# Patient Record
Sex: Female | Born: 1945
Health system: Southern US, Community
[De-identification: ages and names within clinical notes are randomized; demographics above are authoritative.]

## PROBLEM LIST (undated history)

## (undated) DIAGNOSIS — M1990 Unspecified osteoarthritis, unspecified site: Secondary | ICD-10-CM

## (undated) DIAGNOSIS — D649 Anemia, unspecified: Secondary | ICD-10-CM

## (undated) DIAGNOSIS — I10 Essential (primary) hypertension: Secondary | ICD-10-CM

## (undated) DIAGNOSIS — R011 Cardiac murmur, unspecified: Secondary | ICD-10-CM

## (undated) DIAGNOSIS — Z8489 Family history of other specified conditions: Secondary | ICD-10-CM

## (undated) HISTORY — PX: DILATION AND CURETTAGE, DIAGNOSTIC / THERAPEUTIC: SUR384

## (undated) HISTORY — PX: BUNIONECTOMY: SHX129

## (undated) HISTORY — PX: COLONOSCOPY: SHX174

## (undated) HISTORY — PX: TUBAL LIGATION: SHX77

## (undated) HISTORY — PX: EYE SURGERY: SHX253

---

## 1997-09-16 ENCOUNTER — Other Ambulatory Visit: Admission: RE | Admit: 1997-09-16 | Discharge: 1997-09-16 | Payer: Self-pay | Admitting: *Deleted

## 1997-11-13 ENCOUNTER — Other Ambulatory Visit: Admission: RE | Admit: 1997-11-13 | Discharge: 1997-11-13 | Payer: Self-pay | Admitting: *Deleted

## 1998-05-12 ENCOUNTER — Ambulatory Visit (HOSPITAL_COMMUNITY): Admission: RE | Admit: 1998-05-12 | Discharge: 1998-05-12 | Payer: Self-pay

## 1999-07-29 ENCOUNTER — Encounter: Payer: Self-pay | Admitting: *Deleted

## 1999-07-29 ENCOUNTER — Encounter: Admission: RE | Admit: 1999-07-29 | Discharge: 1999-07-29 | Payer: Self-pay | Admitting: *Deleted

## 1999-08-23 ENCOUNTER — Other Ambulatory Visit: Admission: RE | Admit: 1999-08-23 | Discharge: 1999-08-23 | Payer: Self-pay | Admitting: *Deleted

## 1999-08-24 ENCOUNTER — Encounter: Payer: Self-pay | Admitting: *Deleted

## 1999-08-24 ENCOUNTER — Encounter: Admission: RE | Admit: 1999-08-24 | Discharge: 1999-08-24 | Payer: Self-pay | Admitting: *Deleted

## 1999-12-05 ENCOUNTER — Other Ambulatory Visit: Admission: RE | Admit: 1999-12-05 | Discharge: 1999-12-05 | Payer: Self-pay | Admitting: *Deleted

## 2000-02-02 ENCOUNTER — Encounter: Admission: RE | Admit: 2000-02-02 | Discharge: 2000-02-02 | Payer: Self-pay | Admitting: *Deleted

## 2000-02-02 ENCOUNTER — Encounter: Payer: Self-pay | Admitting: *Deleted

## 2000-08-23 ENCOUNTER — Other Ambulatory Visit: Admission: RE | Admit: 2000-08-23 | Discharge: 2000-08-23 | Payer: Self-pay | Admitting: *Deleted

## 2002-07-16 ENCOUNTER — Other Ambulatory Visit: Admission: RE | Admit: 2002-07-16 | Discharge: 2002-07-16 | Payer: Self-pay | Admitting: *Deleted

## 2013-09-21 ENCOUNTER — Encounter: Payer: Self-pay | Admitting: *Deleted

## 2013-09-21 DIAGNOSIS — M059 Rheumatoid arthritis with rheumatoid factor, unspecified: Secondary | ICD-10-CM

## 2015-04-14 ENCOUNTER — Other Ambulatory Visit (HOSPITAL_COMMUNITY): Payer: Self-pay | Admitting: Respiratory Therapy

## 2015-04-14 DIAGNOSIS — J40 Bronchitis, not specified as acute or chronic: Secondary | ICD-10-CM

## 2015-05-28 ENCOUNTER — Encounter (HOSPITAL_COMMUNITY): Payer: Self-pay

## 2015-07-23 ENCOUNTER — Ambulatory Visit (HOSPITAL_COMMUNITY)
Admission: RE | Admit: 2015-07-23 | Discharge: 2015-07-23 | Disposition: A | Discharge status: Home / Self Care | Payer: Medicare Other | Source: Ambulatory Visit | Attending: Internal Medicine | Admitting: Internal Medicine

## 2015-07-23 DIAGNOSIS — J40 Bronchitis, not specified as acute or chronic: Secondary | ICD-10-CM | POA: Diagnosis not present

## 2015-07-23 LAB — PULMONARY FUNCTION TEST
DL/VA % pred: 79 %
DL/VA: 3.73 ml/min/mmHg/L
DLCO unc % pred: 76 %
DLCO unc: 17.48 ml/min/mmHg
FEF 25-75 Post: 1.6 L/sec
FEF 25-75 Pre: 1.53 L/sec
FEF2575-%Change-Post: 4 %
FEF2575-%Pred-Post: 85 %
FEF2575-%Pred-Pre: 81 %
FEV1-%Change-Post: 1 %
FEV1-%Pred-Post: 105 %
FEV1-%Pred-Pre: 104 %
FEV1-Post: 2.31 L
FEV1-Pre: 2.29 L
FEV1FVC-%Change-Post: 5 %
FEV1FVC-%Pred-Pre: 94 %
FEV6-%Change-Post: -3 %
FEV6-%Pred-Post: 107 %
FEV6-%Pred-Pre: 111 %
FEV6-Post: 2.98 L
FEV6-Pre: 3.08 L
FEV6FVC-%Change-Post: 2 %
FEV6FVC-%Pred-Post: 104 %
FEV6FVC-%Pred-Pre: 101 %
FVC-%Change-Post: -4 %
FVC-%Pred-Post: 104 %
FVC-%Pred-Pre: 109 %
FVC-Post: 3.03 L
FVC-Pre: 3.17 L
Post FEV1/FVC ratio: 76 %
Post FEV6/FVC ratio: 100 %
Pre FEV1/FVC ratio: 72 %
Pre FEV6/FVC Ratio: 97 %
RV % pred: 107 %
RV: 2.29 L
TLC % pred: 109 %
TLC: 5.36 L

## 2015-07-23 MED ORDER — ALBUTEROL SULFATE (2.5 MG/3ML) 0.083% IN NEBU
2.5000 mg | INHALATION_SOLUTION | Freq: Once | RESPIRATORY_TRACT | Status: AC
  Administered 2015-07-23: 2.5 mg via RESPIRATORY_TRACT

## 2016-05-05 ENCOUNTER — Other Ambulatory Visit: Payer: Self-pay | Admitting: Internal Medicine

## 2016-05-05 DIAGNOSIS — R1904 Left lower quadrant abdominal swelling, mass and lump: Secondary | ICD-10-CM

## 2016-05-15 ENCOUNTER — Ambulatory Visit
Admission: RE | Admit: 2016-05-15 | Discharge: 2016-05-15 | Disposition: A | Discharge status: Home / Self Care | Payer: Medicare Other | Source: Ambulatory Visit | Attending: Internal Medicine | Admitting: Internal Medicine

## 2016-05-15 ENCOUNTER — Other Ambulatory Visit: Payer: Self-pay | Admitting: Internal Medicine

## 2016-05-15 DIAGNOSIS — R1904 Left lower quadrant abdominal swelling, mass and lump: Secondary | ICD-10-CM

## 2016-06-01 ENCOUNTER — Ambulatory Visit: Payer: Self-pay | Admitting: Surgery

## 2016-06-01 ENCOUNTER — Encounter: Payer: Self-pay | Admitting: Surgery

## 2016-06-01 MED ORDER — VANCOMYCIN HCL 10 G IV SOLR: 1000.0000 mg | INTRAVENOUS | Status: AC

## 2016-06-01 NOTE — H&P (Signed)
Kylie Flowers 06/01/2016 9:06 AM Location: [facility] Patient #: [id number] DOB: 24-Feb-1946 Married / Language: English / Race: White Female  History of Present Illness (Coy Rochford A. Kae Heller MD; 06/01/2016 9:28 AM) Patient words: This is a very pleasant 70 year old woman with a history of rheumatoid arthritis who presents with nearly a 1 year history of a left lower quadrant hernia causes her to have attacks significant pain and occasional nausea and vomiting. The pain is focally around the hernia and lower abdomen. Usually the symptoms are self resolving. She has never had incarceration and has never been treated in the emergency department. Denies any changes in bowel habits. Her only prior abdominal surgery was a tubal ligation through a Pfannenstiel which is remote from the region of the hernia.  She does smoke about 5 cigarettes a day. No alcohol or other drug use and is moderately active.  The patient is a 70 year old female.   Other Problems Nance Pear, CMA; 06/01/2016 9:07 AM) Heart murmur High blood pressure Hypercholesterolemia Inguinal Hernia Other disease, cancer, significant illness  Past Surgical History Nance Pear, CMA; 06/01/2016 9:07 AM) Cataract Surgery Bilateral. Colon Polyp Removal - Colonoscopy Foot Surgery Bilateral. Oral Surgery  Diagnostic Studies History Nance Pear, CMA; 06/01/2016 9:07 AM) Colonoscopy within last year Pap Smear >5 years ago  Allergies Nance Pear, CMA; 06/01/2016 9:09 AM) PenicillAMINE *Miscellaneous Therapeutic Classes  Medication History Nance Pear, CMA; 06/01/2016 9:09 AM) Alendronate Sodium (70MG  Tablet, Oral once a week) Active. Folic Acid (1MG  Tablet, Oral daily) Active. Methotrexate (2.5MG  Tablet, Oral daily) Active. PredniSONE (1MG  Tablet, Oral daily) Active. Medications Reconciled  Social History Nance Pear, [place]; 06/01/2016 9:07 AM) Alcohol use Occasional alcohol  use. Caffeine use Carbonated beverages, Coffee, Tea. No drug use Tobacco use Current some day smoker.  Family History Nance Pear, [place]; 06/01/2016 9:07 AM) Arthritis Mother. Cancer Father, Mother, Sister. Cerebrovascular Accident Mother. Depression Mother. Hypertension Mother. Respiratory Condition Father, Mother.  Pregnancy / Birth History Nance Pear, [place]; 06/01/2016 9:07 AM) Age at menarche 32 years. Age of menopause 40-50 Contraceptive History Oral contraceptives. Gravida 0 Para 0 Regular periods     Review of Systems Nance Pear CMA; 06/01/2016 9:07 AM) General Not Present- Appetite Loss, Chills, Fatigue, Fever, Night Sweats, Weight Gain and Weight Loss. Skin Not Present- Change in Wart/Mole, Dryness, Hives, Jaundice, New Lesions, Non-Healing Wounds, Rash and Ulcer. HEENT Not Present- Earache, Hearing Loss, Hoarseness, Nose Bleed, Oral Ulcers, Ringing in the Ears, Seasonal Allergies, Sinus Pain, Sore Throat, Visual Disturbances, Wears glasses/contact lenses and Yellow Eyes. Respiratory Not Present- Bloody sputum, Chronic Cough, Difficulty Breathing, Snoring and Wheezing. Breast Not Present- Breast Mass, Breast Pain, Nipple Discharge and Skin Changes. Cardiovascular Not Present- Chest Pain, Difficulty Breathing Lying Down, Leg Cramps, Palpitations, Rapid Heart Rate, Shortness of Breath and Swelling of Extremities. Gastrointestinal Not Present- Abdominal Pain, Bloating, Bloody Stool, Change in Bowel Habits, Chronic diarrhea, Constipation, Difficulty Swallowing, Excessive gas, Gets full quickly at meals, Hemorrhoids, Indigestion, Nausea, Rectal Pain and Vomiting. Female Genitourinary Not Present- Frequency, Nocturia, Painful Urination, Pelvic Pain and Urgency. Musculoskeletal Not Present- Back Pain, Joint Pain, Joint Stiffness, Muscle Pain, Muscle Weakness and Swelling of Extremities. Neurological Not Present- Decreased Memory, Fainting, Headaches,  Numbness, Seizures, Tingling, Tremor, Trouble walking and Weakness. Psychiatric Not Present- Anxiety, Bipolar, Change in Sleep Pattern, Depression, Fearful and Frequent crying. Endocrine Not Present- Cold Intolerance, Excessive Hunger, Hair Changes, Heat Intolerance, Hot flashes and New Diabetes. Hematology Present- Easy Bruising. Not Present- Blood Thinners, Excessive bleeding, Gland problems,  HIV and Persistent Infections.  Vitals Bary Castilla Bradford CMA; 06/01/2016 9:11 AM) 06/01/2016 9:10 AM Height: 62in Temp.: 98.38F  Pulse: 74 (Regular)  BP: 160/80 (Sitting, Left Arm, Standard)      Physical Exam (Cassy Sprowl A. Kae Heller MD; 06/01/2016 9:28 AM)  The physical exam findings are as follows: Note:AOx3, well apeparing, husband here with her. EOMI, PERRL, MMM Neck no mass or thyromegaly Unlabored resp, RRR Abdomen soft, nontender, nondistended. Vaguely palpable LLQ hernia with bowel, several inches above and lateral to jer very low pfannenstiel scar Ext WWP Neuro grossly intact Psych normal mood and affect    Assessment & Plan (Rowe Warman A. Akul Leggette MD; 06/01/2016 9:30 AM)  HERNIA (Principal Diagnosis) (K46.9) Story: I suspect she has a spigelian hernia given the location and no prior surgery in the area. Will obtain a CT to further characterize and size. Ideally will plan a laparoscopic repair with mesh. Follow up after CT.

## 2016-06-02 ENCOUNTER — Other Ambulatory Visit: Payer: Self-pay | Admitting: Surgery

## 2016-06-02 DIAGNOSIS — K469 Unspecified abdominal hernia without obstruction or gangrene: Secondary | ICD-10-CM

## 2016-06-07 ENCOUNTER — Ambulatory Visit
Admission: RE | Admit: 2016-06-07 | Discharge: 2016-06-07 | Disposition: A | Discharge status: Home / Self Care | Payer: Medicare Other | Source: Ambulatory Visit | Attending: Surgery | Admitting: Surgery

## 2016-06-07 DIAGNOSIS — K469 Unspecified abdominal hernia without obstruction or gangrene: Secondary | ICD-10-CM

## 2016-06-07 MED ORDER — IOPAMIDOL (ISOVUE-300) INJECTION 61%
100.0000 mL | Freq: Once | INTRAVENOUS | Status: AC | PRN
  Administered 2016-06-07: 100 mL via INTRAVENOUS

## 2016-07-18 ENCOUNTER — Ambulatory Visit (HOSPITAL_COMMUNITY)
Admission: RE | Admit: 2016-07-18 | Discharge: 2016-07-18 | Disposition: A | Discharge status: Home / Self Care | Payer: Medicare Other | Source: Ambulatory Visit | Attending: Surgery | Admitting: Surgery

## 2016-07-18 ENCOUNTER — Other Ambulatory Visit (HOSPITAL_COMMUNITY): Payer: Self-pay | Admitting: *Deleted

## 2016-07-18 ENCOUNTER — Encounter (HOSPITAL_COMMUNITY): Payer: Self-pay

## 2016-07-18 ENCOUNTER — Encounter (HOSPITAL_COMMUNITY)
Admission: RE | Admit: 2016-07-18 | Discharge: 2016-07-18 | Disposition: A | Discharge status: Home / Self Care | Payer: Medicare Other | Source: Ambulatory Visit | Attending: Surgery | Admitting: Surgery

## 2016-07-18 DIAGNOSIS — Z01818 Encounter for other preprocedural examination: Secondary | ICD-10-CM

## 2016-07-18 DIAGNOSIS — F17200 Nicotine dependence, unspecified, uncomplicated: Secondary | ICD-10-CM | POA: Diagnosis not present

## 2016-07-18 DIAGNOSIS — R918 Other nonspecific abnormal finding of lung field: Secondary | ICD-10-CM | POA: Diagnosis not present

## 2016-07-18 DIAGNOSIS — I700 Atherosclerosis of aorta: Secondary | ICD-10-CM | POA: Diagnosis not present

## 2016-07-18 HISTORY — DX: Unspecified osteoarthritis, unspecified site: M19.90

## 2016-07-18 HISTORY — DX: Essential (primary) hypertension: I10

## 2016-07-18 HISTORY — DX: Cardiac murmur, unspecified: R01.1

## 2016-07-18 HISTORY — DX: Family history of other specified conditions: Z84.89

## 2016-07-18 HISTORY — DX: Anemia, unspecified: D64.9

## 2016-07-18 LAB — COMPREHENSIVE METABOLIC PANEL
ALT: 14 U/L (ref 14–54)
AST: 24 U/L (ref 15–41)
Albumin: 4.2 g/dL (ref 3.5–5.0)
Alkaline Phosphatase: 82 U/L (ref 38–126)
Anion gap: 12 (ref 5–15)
BUN: 17 mg/dL (ref 6–20)
CO2: 23 mmol/L (ref 22–32)
Calcium: 9.4 mg/dL (ref 8.9–10.3)
Chloride: 103 mmol/L (ref 101–111)
Creatinine, Ser: 0.72 mg/dL (ref 0.44–1.00)
GFR calc Af Amer: >60 mL/min (ref >60)
GFR calc non Af Amer: >60 mL/min (ref >60)
Glucose, Bld: 93 mg/dL (ref 65–99)
Potassium: 3.7 mmol/L (ref 3.5–5.1)
Sodium: 138 mmol/L (ref 135–145)
Total Bilirubin: 0.5 mg/dL (ref 0.3–1.2)
Total Protein: 7.6 g/dL (ref 6.5–8.1)

## 2016-07-18 LAB — CBC WITH DIFFERENTIAL/PLATELET
Basophils Absolute: 0 10*3/uL (ref 0.0–0.1)
Basophils Relative: 0 %
Eosinophils Absolute: 0.1 10*3/uL (ref 0.0–0.7)
Eosinophils Relative: 1 %
HCT: 42.1 % (ref 36.0–46.0)
Hemoglobin: 14 g/dL (ref 12.0–15.0)
Lymphocytes Relative: 29 %
Lymphs Abs: 2.9 10*3/uL (ref 0.7–4.0)
MCH: 31.7 pg (ref 26.0–34.0)
MCHC: 33.3 g/dL (ref 30.0–36.0)
MCV: 95.2 fL (ref 78.0–100.0)
Monocytes Absolute: 0.5 10*3/uL (ref 0.1–1.0)
Monocytes Relative: 5 %
Neutro Abs: 6.5 10*3/uL (ref 1.7–7.7)
Neutrophils Relative %: 65 %
Platelets: 254 10*3/uL (ref 150–400)
RBC: 4.42 MIL/uL (ref 3.87–5.11)
RDW: 14.2 % (ref 11.5–15.5)
WBC: 10 10*3/uL (ref 4.0–10.5)

## 2016-07-18 LAB — PROTIME-INR
INR: UNDETERMINED
Prothrombin Time: 12.9 seconds (ref 11.4–15.2)

## 2016-07-18 NOTE — Progress Notes (Signed)
Pt denies cardiac history, chest pain or sob. Pt brought EKG from Dr. Noland Fordyce office with her today. Placed in chart.

## 2016-07-18 NOTE — Pre-Procedure Instructions (Signed)
Kylie Flowers  07/18/2016    Your procedure is scheduled on Friday, July 21, 2016 at 11:00 AM.   Report to [facility] Entrance "A" Admitting Office at 9:00 AM.   Call this number if you have problems the morning of surgery: [phone]   Questions prior to day of surgery, please call [phone] between 8 & 4 PM.   Remember:  Do not eat food or drink liquids after midnight Thursday, 07/20/16.  Take these medicines the morning of surgery with A SIP OF WATER: Prednisone (Deltasone)   Do not wear jewelry, make-up or nail polish.  Do not wear lotions, powders, or perfumes.  Do not shave 48 hours prior to surgery.    Do not bring valuables to the hospital.  [facility] is not responsible for any belongings or valuables.  Contacts, dentures or bridgework may not be worn into surgery.  Leave your suitcase in the car.  After surgery it may be brought to your room.  For patients admitted to the hospital, discharge time will be determined by your treatment team.  Patients discharged the day of surgery will not be allowed to drive home.   Special instructions:  [facility] - Preparing for Surgery  Before surgery, you can play an important role.  Because skin is not sterile, your skin needs to be as free of germs as possible.  You can reduce the number of germs on you skin by washing with CHG (chlorahexidine gluconate) soap before surgery.  CHG is an antiseptic cleaner which kills germs and bonds with the skin to continue killing germs even after washing.  Please DO NOT use if you have an allergy to CHG or antibacterial soaps.  If your skin becomes reddened/irritated stop using the CHG and inform your nurse when you arrive at Short Stay.  Do not shave (including legs and underarms) for at least 48 hours prior to the first CHG shower.  You may shave your face.  Please follow these instructions carefully:   1.  Shower with CHG Soap the night before surgery and the                     morning of Surgery.  2.  If you choose to wash your hair, wash your hair first as usual with your       normal shampoo.  3.  After you shampoo, rinse your hair and body thoroughly to remove the shampoo.  4.  Use CHG as you would any other liquid soap.  You can apply chg directly       to the skin and wash gently with scrungie or a clean washcloth.  5.  Apply the CHG Soap to your body ONLY FROM THE NECK DOWN.        Do not use on open wounds or open sores.  Avoid contact with your eyes, ears, mouth and genitals (private parts).  Wash genitals (private parts) with your normal soap.  6.  Wash thoroughly, paying special attention to the area where your surgery        will be performed.  7.  Thoroughly rinse your body with warm water from the neck down.  8.  DO NOT shower/wash with your normal soap after using and rinsing off       the CHG Soap.  9.  Pat yourself dry with a clean towel.            10.  Wear clean  pajamas.            11.  Place clean sheets on your bed the night of your first shower and do not        sleep with pets.  Day of Surgery  Do not apply any lotions the morning of surgery.  Please wear clean clothes to the hospital.   Please read over the fact sheets that you were given.

## 2016-07-21 ENCOUNTER — Encounter (HOSPITAL_COMMUNITY)
Admission: RE | Disposition: A | Discharge status: Home / Self Care | Payer: Self-pay | Source: Ambulatory Visit | Attending: Surgery

## 2016-07-21 ENCOUNTER — Ambulatory Visit (HOSPITAL_COMMUNITY): Payer: Medicare Other | Admitting: Certified Registered Nurse Anesthetist

## 2016-07-21 ENCOUNTER — Ambulatory Visit (HOSPITAL_COMMUNITY)
Admission: RE | Admit: 2016-07-21 | Discharge: 2016-07-21 | Disposition: A | Discharge status: Home / Self Care | Payer: Medicare Other | Source: Ambulatory Visit | Attending: Surgery | Admitting: Surgery

## 2016-07-21 ENCOUNTER — Encounter (HOSPITAL_COMMUNITY): Payer: Self-pay | Admitting: *Deleted

## 2016-07-21 DIAGNOSIS — F17210 Nicotine dependence, cigarettes, uncomplicated: Secondary | ICD-10-CM | POA: Diagnosis not present

## 2016-07-21 DIAGNOSIS — E7800 Pure hypercholesterolemia, unspecified: Secondary | ICD-10-CM | POA: Insufficient documentation

## 2016-07-21 DIAGNOSIS — I10 Essential (primary) hypertension: Secondary | ICD-10-CM | POA: Diagnosis not present

## 2016-07-21 DIAGNOSIS — K4090 Unilateral inguinal hernia, without obstruction or gangrene, not specified as recurrent: Secondary | ICD-10-CM | POA: Insufficient documentation

## 2016-07-21 DIAGNOSIS — Z79899 Other long term (current) drug therapy: Secondary | ICD-10-CM | POA: Diagnosis not present

## 2016-07-21 HISTORY — PX: INSERTION OF MESH: SHX5868

## 2016-07-21 HISTORY — PX: INGUINAL HERNIA REPAIR: SHX194

## 2016-07-21 SURGERY — INSERTION OF MESH
Anesthesia: General | Site: Abdomen

## 2016-07-21 MED ORDER — CELECOXIB 200 MG PO CAPS
400.0000 mg | ORAL_CAPSULE | ORAL | Status: AC
  Administered 2016-07-21: 400 mg via ORAL
  Filled 2016-07-21: qty 2

## 2016-07-21 MED ORDER — GABAPENTIN 300 MG PO CAPS
300.0000 mg | ORAL_CAPSULE | ORAL | Status: AC
  Administered 2016-07-21: 300 mg via ORAL
  Filled 2016-07-21: qty 1

## 2016-07-21 MED ORDER — SUGAMMADEX SODIUM 200 MG/2ML IV SOLN
INTRAVENOUS | Status: AC
  Filled 2016-07-21: qty 2

## 2016-07-21 MED ORDER — PROPOFOL 10 MG/ML IV BOLUS
INTRAVENOUS | Status: DC | PRN
Start: 2016-07-21 — End: 2016-07-21
  Administered 2016-07-21: 120 mg via INTRAVENOUS

## 2016-07-21 MED ORDER — DOCUSATE SODIUM 100 MG PO CAPS: 100.0000 mg | ORAL_CAPSULE | Freq: Two times a day (BID) | ORAL | 0 refills | Status: AC

## 2016-07-21 MED ORDER — CHLORHEXIDINE GLUCONATE CLOTH 2 % EX PADS: 6.0000 | MEDICATED_PAD | Freq: Once | CUTANEOUS | Status: DC

## 2016-07-21 MED ORDER — 0.9 % SODIUM CHLORIDE (POUR BTL) OPTIME
TOPICAL | Status: DC | PRN
  Administered 2016-07-21: 1000 mL

## 2016-07-21 MED ORDER — LACTATED RINGERS IV SOLN
INTRAVENOUS | Status: DC | PRN
  Administered 2016-07-21: 09:00:00 via INTRAVENOUS

## 2016-07-21 MED ORDER — ROCURONIUM BROMIDE 10 MG/ML (PF) SYRINGE
PREFILLED_SYRINGE | INTRAVENOUS | Status: DC | PRN
  Administered 2016-07-21: 40 mg via INTRAVENOUS

## 2016-07-21 MED ORDER — FENTANYL CITRATE (PF) 100 MCG/2ML IJ SOLN
INTRAMUSCULAR | Status: DC | PRN
  Administered 2016-07-21: 100 ug via INTRAVENOUS
  Administered 2016-07-21 (×2): 50 ug via INTRAVENOUS

## 2016-07-21 MED ORDER — BUPIVACAINE-EPINEPHRINE 0.25% -1:200000 IJ SOLN
INTRAMUSCULAR | Status: DC | PRN
  Administered 2016-07-21: 10 mL

## 2016-07-21 MED ORDER — FENTANYL CITRATE (PF) 100 MCG/2ML IJ SOLN
INTRAMUSCULAR | Status: AC
  Filled 2016-07-21: qty 4

## 2016-07-21 MED ORDER — BACITRACIN-NEOMYCIN-POLYMYXIN 400-5-5000 EX OINT
TOPICAL_OINTMENT | CUTANEOUS | Status: AC
  Filled 2016-07-21: qty 1

## 2016-07-21 MED ORDER — LIDOCAINE 2% (20 MG/ML) 5 ML SYRINGE
INTRAMUSCULAR | Status: DC | PRN
  Administered 2016-07-21: 40 mg via INTRAVENOUS

## 2016-07-21 MED ORDER — ONDANSETRON HCL 4 MG/2ML IJ SOLN
INTRAMUSCULAR | Status: DC | PRN
  Administered 2016-07-21: 4 mg via INTRAVENOUS

## 2016-07-21 MED ORDER — VANCOMYCIN HCL IN DEXTROSE 1-5 GM/200ML-% IV SOLN
INTRAVENOUS | Status: AC
  Filled 2016-07-21: qty 200

## 2016-07-21 MED ORDER — BUPIVACAINE-EPINEPHRINE (PF) 0.25% -1:200000 IJ SOLN
INTRAMUSCULAR | Status: AC
  Filled 2016-07-21: qty 30

## 2016-07-21 MED ORDER — ONDANSETRON HCL 4 MG/2ML IJ SOLN
INTRAMUSCULAR | Status: AC
  Filled 2016-07-21: qty 2

## 2016-07-21 MED ORDER — PHENYLEPHRINE HCL 10 MG/ML IJ SOLN
INTRAVENOUS | Status: DC | PRN
  Administered 2016-07-21: 20 ug/min via INTRAVENOUS

## 2016-07-21 MED ORDER — EPHEDRINE 5 MG/ML INJ
INTRAVENOUS | Status: AC
  Filled 2016-07-21: qty 10

## 2016-07-21 MED ORDER — LACTATED RINGERS IV SOLN
INTRAVENOUS | Status: DC
  Administered 2016-07-21: 10:00:00 via INTRAVENOUS

## 2016-07-21 MED ORDER — ACETAMINOPHEN 500 MG PO TABS
1000.0000 mg | ORAL_TABLET | ORAL | Status: AC
  Administered 2016-07-21: 1000 mg via ORAL
  Filled 2016-07-21: qty 2

## 2016-07-21 MED ORDER — DEXAMETHASONE SODIUM PHOSPHATE 10 MG/ML IJ SOLN
INTRAMUSCULAR | Status: DC | PRN
  Administered 2016-07-21: 10 mg via INTRAVENOUS

## 2016-07-21 MED ORDER — EPHEDRINE SULFATE-NACL 50-0.9 MG/10ML-% IV SOSY
PREFILLED_SYRINGE | INTRAVENOUS | Status: DC | PRN
  Administered 2016-07-21 (×2): 10 mg via INTRAVENOUS
  Administered 2016-07-21: 5 mg via INTRAVENOUS
  Administered 2016-07-21: 10 mg via INTRAVENOUS

## 2016-07-21 MED ORDER — VANCOMYCIN HCL 1000 MG IV SOLR
INTRAVENOUS | Status: DC | PRN
  Administered 2016-07-21: 1000 mg via INTRAVENOUS

## 2016-07-21 MED ORDER — SUGAMMADEX SODIUM 200 MG/2ML IV SOLN
INTRAVENOUS | Status: DC | PRN
  Administered 2016-07-21: 200 mg via INTRAVENOUS

## 2016-07-21 MED ORDER — LIDOCAINE 2% (20 MG/ML) 5 ML SYRINGE
INTRAMUSCULAR | Status: AC
  Filled 2016-07-21: qty 5

## 2016-07-21 MED ORDER — ROCURONIUM BROMIDE 50 MG/5ML IV SOSY
PREFILLED_SYRINGE | INTRAVENOUS | Status: AC
  Filled 2016-07-21: qty 10

## 2016-07-21 MED ORDER — OXYCODONE-ACETAMINOPHEN 5-325 MG PO TABS: 1.0000 | ORAL_TABLET | Freq: Four times a day (QID) | ORAL | 0 refills | Status: DC | PRN

## 2016-07-21 MED ORDER — PHENYLEPHRINE 40 MCG/ML (10ML) SYRINGE FOR IV PUSH (FOR BLOOD PRESSURE SUPPORT)
PREFILLED_SYRINGE | INTRAVENOUS | Status: DC | PRN
  Administered 2016-07-21: 80 ug via INTRAVENOUS
  Administered 2016-07-21: 120 ug via INTRAVENOUS
  Administered 2016-07-21: 80 ug via INTRAVENOUS

## 2016-07-21 MED ORDER — PHENYLEPHRINE 40 MCG/ML (10ML) SYRINGE FOR IV PUSH (FOR BLOOD PRESSURE SUPPORT)
PREFILLED_SYRINGE | INTRAVENOUS | Status: AC
  Filled 2016-07-21: qty 10

## 2016-07-21 MED ORDER — PROPOFOL 10 MG/ML IV BOLUS
INTRAVENOUS | Status: AC
  Filled 2016-07-21: qty 20

## 2016-07-21 SURGICAL SUPPLY — 50 items
APPLIER CLIP 5 13 M/L LIGAMAX5 (MISCELLANEOUS) ×4
BENZOIN TINCTURE PRP APPL 2/3 (GAUZE/BANDAGES/DRESSINGS) IMPLANT
BINDER ABDOMINAL 12 ML 46-62 (SOFTGOODS) IMPLANT
BLADE SURG ROTATE 9660 (MISCELLANEOUS) IMPLANT
CANISTER SUCTION 2500CC (MISCELLANEOUS) ×4 IMPLANT
CHLORAPREP W/TINT 26ML (MISCELLANEOUS) ×4 IMPLANT
CLIP APPLIE 5 13 M/L LIGAMAX5 (MISCELLANEOUS) ×2 IMPLANT
CLOSURE WOUND 1/2 X4 (GAUZE/BANDAGES/DRESSINGS) ×1
COVER SURGICAL LIGHT HANDLE (MISCELLANEOUS) ×4 IMPLANT
DERMABOND ADVANCED (GAUZE/BANDAGES/DRESSINGS) ×2
DERMABOND ADVANCED .7 DNX12 (GAUZE/BANDAGES/DRESSINGS) ×2 IMPLANT
DEVICE SECURE STRAP 25 ABSORB (INSTRUMENTS) ×4 IMPLANT
DEVICE TROCAR PUNCTURE CLOSURE (ENDOMECHANICALS) ×4 IMPLANT
DRSG TEGADERM 2-3/8X2-3/4 SM (GAUZE/BANDAGES/DRESSINGS) IMPLANT
ELECT REM PT RETURN 9FT ADLT (ELECTROSURGICAL) ×4
ELECTRODE REM PT RTRN 9FT ADLT (ELECTROSURGICAL) ×2 IMPLANT
FILTER SMOKE EVAC LAPAROSHD (FILTER) IMPLANT
GAUZE SPONGE 2X2 8PLY STRL LF (GAUZE/BANDAGES/DRESSINGS) ×2 IMPLANT
GLOVE BIO SURGEON STRL SZ7 (GLOVE) ×4 IMPLANT
GLOVE BIOGEL PI IND STRL 7.0 (GLOVE) ×2 IMPLANT
GLOVE BIOGEL PI IND STRL 7.5 (GLOVE) ×2 IMPLANT
GLOVE BIOGEL PI INDICATOR 7.0 (GLOVE) ×2
GLOVE BIOGEL PI INDICATOR 7.5 (GLOVE) ×2
GLOVE SURG SS PI 6.5 STRL IVOR (GLOVE) ×4 IMPLANT
GOWN STRL REUS W/ TWL LRG LVL3 (GOWN DISPOSABLE) ×6 IMPLANT
GOWN STRL REUS W/TWL LRG LVL3 (GOWN DISPOSABLE) ×6
KIT BASIN OR (CUSTOM PROCEDURE TRAY) ×4 IMPLANT
KIT ROOM TURNOVER OR (KITS) ×4 IMPLANT
MARKER SKIN DUAL TIP RULER LAB (MISCELLANEOUS) ×4 IMPLANT
MESH ULTRAPRO 3X6 7.6X15CM (Mesh General) ×4 IMPLANT
NEEDLE SPNL 22GX3.5 QUINCKE BK (NEEDLE) ×4 IMPLANT
NS IRRIG 1000ML POUR BTL (IV SOLUTION) ×4 IMPLANT
PAD ARMBOARD 7.5X6 YLW CONV (MISCELLANEOUS) ×8 IMPLANT
SCALPEL HARMONIC ACE (MISCELLANEOUS) IMPLANT
SCISSORS LAP 5X35 DISP (ENDOMECHANICALS) ×4 IMPLANT
SET IRRIG TUBING LAPAROSCOPIC (IRRIGATION / IRRIGATOR) IMPLANT
SLEEVE ENDOPATH XCEL 5M (ENDOMECHANICALS) ×4 IMPLANT
SPONGE GAUZE 2X2 STER 10/PKG (GAUZE/BANDAGES/DRESSINGS) ×2
STRIP CLOSURE SKIN 1/2X4 (GAUZE/BANDAGES/DRESSINGS) ×3 IMPLANT
SUT MNCRL AB 4-0 PS2 18 (SUTURE) ×4 IMPLANT
SUT NOVA NAB GS-21 0 18 T12 DT (SUTURE) ×4 IMPLANT
TOWEL OR 17X24 6PK STRL BLUE (TOWEL DISPOSABLE) ×4 IMPLANT
TOWEL OR 17X26 10 PK STRL BLUE (TOWEL DISPOSABLE) IMPLANT
TRAY FOLEY CATH 14FRSI W/METER (CATHETERS) ×4 IMPLANT
TRAY LAPAROSCOPIC MC (CUSTOM PROCEDURE TRAY) ×4 IMPLANT
TROCAR BLADELESS 12MM (ENDOMECHANICALS) ×4 IMPLANT
TROCAR XCEL BLUNT TIP 100MML (ENDOMECHANICALS) IMPLANT
TROCAR XCEL NON-BLD 11X100MML (ENDOMECHANICALS) ×4 IMPLANT
TROCAR XCEL NON-BLD 5MMX100MML (ENDOMECHANICALS) ×4 IMPLANT
TUBING INSUFFLATION (TUBING) ×4 IMPLANT

## 2016-07-21 NOTE — Op Note (Signed)
Operative Report  Kylie Flowers 70 y.o. female  [id number]  [id number]  07/21/2016  Surgeon: Clovis Riley   Assistant: none  Procedure performed: Laparoscopic TAPP repair of indirect left inguinal hernia with ultrapro mesh  Preop diagnosis: left lower quadrant hernia  Post-op diagnosis/intraop findings: left indirect inguinal hernia  Specimens: none  EBL: minimal  Complications: none  Description of procedure: After obtaining informed consent the patient was brought to the operating room. Antibiotics and subcutaneous heparin were administered. SCD's were applied. General endotracheal anesthesia was initiated and a formal time-out was performed. The abdomen was prepped and draped in the usual sterile fashion and the abdomen was entered using an Optiview trocar Palmer's point. Insufflation to 15 mmHg was obtained and inspection revealed no evidence of injury from our entry. The abdomen was inspected, I had thought her to have a spiculated hernia on the left side based on her CT scan however it actually appeared to be an indirect inguinal hernia on the left side without any incarcerated contents. I proceeded to place a 12 mm infraumbilical trocar and a right hemiabdomen 5 mm trocar and then began by dissecting the peritoneum away from the anterior abdominal wall using comminution of electrocautery and sharp dissection. The flap was further developed bluntly until the hernia sac was completely reduced. Cooper's ligament was cleared off and the peritoneum was dissected away inferiorly to provide adequate space for mesh. The round ligament was divided. The inferior epigastric and femoral vessels were visualized and preserved. A piece of ultra Pro mesh was then brought to the field and trimmed to fit the site. This is introduced and oriented to lie flat against the hernia with appropriate overlap. Using a secure strap tacker, this was tacked to Cooper's ligament in 2 places and then  tacked superiorly on on either side of the inferior epigastric vessels. The peritoneum was then brought back up to cover the mesh completely while ensuring that the mesh remained lying flat. The peritoneum was fixed back in place again using the secure strap tacker. The field was again inspected. There is no evidence of bowel injury and the peritoneal flap was intact without any defects. Hemostasis was ensured. The abdomen was then desufflated and all of her trochars removed. The infraumbilical fascia was closed with a 0 Vicryl. The skin incisions were closed with running subcuticular monocryl and Dermabond. The patient was awakened, extubated and transported to the recovery room in stable condition.   All counts were correct at the completion of the case.

## 2016-07-21 NOTE — Discharge Instructions (Signed)
[facility] _______[facility]  UMBILICAL OR INGUINAL HERNIA REPAIR: POST OP INSTRUCTIONS  Always review your discharge instruction sheet given to you by the facility where your surgery was performed. IF YOU HAVE DISABILITY OR FAMILY LEAVE FORMS, YOU MUST BRING THEM TO THE OFFICE FOR PROCESSING.   DO NOT GIVE THEM TO YOUR DOCTOR.  1. A  prescription for pain medication may be given to you upon discharge.  Take your pain medication as prescribed, if needed.  If narcotic pain medicine is not needed, then you may take acetaminophen (Tylenol) or ibuprofen (Advil) as needed. 2. Take your usually prescribed medications unless otherwise directed. If you need a refill on your pain medication, please contact your pharmacy.  They will contact our office to request authorization. Prescriptions will not be filled after 5 pm or on week-ends. 3. You should follow a light diet the first 24 hours after arrival home, such as soup and crackers, etc.  Be sure to include lots of fluids daily.  Resume your normal diet the day after surgery. 4.Most patients will experience some swelling and bruising around the umbilicus or in the groin.  Ice packs and reclining will help.  Swelling and bruising can take several days to resolve.  6. It is common to experience some constipation if taking pain medication after surgery.  Increasing fluid intake and taking a stool softener (such as Colace) will usually help or prevent this problem from occurring.  A mild laxative (Milk of Magnesia or Miralax) should be taken according to package directions if there are no bowel movements after 48 hours. 7. Unless discharge instructions indicate otherwise, you may remove your bandages 24-48 hours after surgery, and you may shower at that time.  You may have steri-strips (small skin tapes) in place directly over the incision.  These strips should be left on the skin for 7-10 days.  If your surgeon used skin glue on the incision, you may  shower in 24 hours.  The glue will flake off over the next 2-3 weeks.  Any sutures or staples will be removed at the office during your follow-up visit. 8. ACTIVITIES:  You may resume regular (light) daily activities beginning the next day--such as daily self-care, walking, climbing stairs--gradually increasing activities as tolerated.  You may have sexual intercourse when it is comfortable.  Refrain from any heavy lifting or straining until approved by your doctor.  a.You may drive when you are no longer taking prescription pain medication, you can comfortably wear a seatbelt, and you can safely maneuver your car and apply brakes. b.RETURN TO WORK:   _____________________________________________  9.You should see your doctor in the office for a follow-up appointment approximately 2-3 weeks after your surgery.  Make sure that you call for this appointment within a day or two after you arrive home to insure a convenient appointment time. 10.OTHER INSTRUCTIONS: _________________________    _____________________________________  WHEN TO CALL YOUR DOCTOR: 1. Fever over 101.0 2. Inability to urinate 3. Nausea and/or vomiting 4. Extreme swelling or bruising 5. Continued bleeding from incision. 6. Increased pain, redness, or drainage from the incision  The clinic staff is available to answer your questions during regular business hours.  Please dont hesitate to call and ask to speak to one of the nurses for clinical concerns.  If you have a medical emergency, go to the nearest emergency room or call 911.  A surgeon from [facility] is always on call at the hospital   [address]  [address] [place], [place], [place]  [address] ?  [address], [place], [place]   [address] [phone] ? [phone] ? FAX [phone] Web site: [email or web address]

## 2016-07-21 NOTE — Anesthesia Procedure Notes (Signed)
Procedure Name: Intubation Date/Time: 07/21/2016 11:00 AM Performed by: Garrison Columbus T Pre-anesthesia Checklist: Patient identified, Emergency Drugs available, Suction available and Patient being monitored Patient Re-evaluated:Patient Re-evaluated prior to inductionOxygen Delivery Method: Circle System Utilized Preoxygenation: Pre-oxygenation with 100% oxygen Intubation Type: IV induction Ventilation: Mask ventilation without difficulty and Oral airway inserted - appropriate to patient size Laryngoscope Size: Sabra Heck and 2 Grade View: Grade I Tube type: Oral Number of attempts: 1 Airway Equipment and Method: Stylet and Oral airway Placement Confirmation: ETT inserted through vocal cords under direct vision,  positive ETCO2 and breath sounds checked- equal and bilateral Secured at: 21 cm Tube secured with: Tape Dental Injury: Teeth and Oropharynx as per pre-operative assessment

## 2016-07-21 NOTE — H&P (Signed)
Kylie Flowers Patient #: [id number] DOB: 22-May-1946 Married / Language: English / Race: White Female  History of Present Illness  Patient words: This is a very pleasant 70 year old woman with a history of rheumatoid arthritis who presents with nearly a 1 year history of a left lower quadrant hernia causes her to have attacks significant pain and occasional nausea and vomiting. The pain is focally around the hernia and lower abdomen. Usually the symptoms are self resolving. She has never had incarceration and has never been treated in the emergency department. Denies any changes in bowel habits. Her only prior abdominal surgery was a tubal ligation through a Pfannenstiel which is remote from the region of the hernia.  She does smoke about 5 cigarettes a day. No alcohol or other drug use and is moderately active.    Other Problems  Heart murmur High blood pressure Hypercholesterolemia Inguinal Hernia Other disease, cancer, significant illness  Past Surgical History  Cataract Surgery Bilateral. Colon Polyp Removal - Colonoscopy Foot Surgery Bilateral. Oral Surgery  Diagnostic Studies History  Colonoscopy within last year Pap Smear >5 years ago  Allergies  PenicillAMINE *Miscellaneous Therapeutic Classes  Medication History  Alendronate Sodium (70MG  Tablet, Oral once a week) Active. Folic Acid (1MG  Tablet, Oral daily) Active. Methotrexate (2.5MG  Tablet, Oral daily) Active. PredniSONE (1MG  Tablet, Oral daily) Active. Medications Reconciled  Social History  Alcohol use Occasional alcohol use. Caffeine use Carbonated beverages, Coffee, Tea. No drug use Tobacco use Current some day smoker.  Family History Arthritis Mother. Cancer Father, Mother, Sister. Cerebrovascular Accident Mother. Depression Mother. Hypertension Mother. Respiratory Condition Father, Mother.  Pregnancy / Birth History  Age at menarche 25 years. Age of  menopause 90-50 Contraceptive History Oral contraceptives. Gravida 0 Para 0 Regular periods     Review of Systems  General Not Present- Appetite Loss, Chills, Fatigue, Fever, Night Sweats, Weight Gain and Weight Loss. Skin Not Present- Change in Wart/Mole, Dryness, Hives, Jaundice, New Lesions, Non-Healing Wounds, Rash and Ulcer. HEENT Not Present- Earache, Hearing Loss, Hoarseness, Nose Bleed, Oral Ulcers, Ringing in the Ears, Seasonal Allergies, Sinus Pain, Sore Throat, Visual Disturbances, Wears glasses/contact lenses and Yellow Eyes. Respiratory Not Present- Bloody sputum, Chronic Cough, Difficulty Breathing, Snoring and Wheezing. Breast Not Present- Breast Mass, Breast Pain, Nipple Discharge and Skin Changes. Cardiovascular Not Present- Chest Pain, Difficulty Breathing Lying Down, Leg Cramps, Palpitations, Rapid Heart Rate, Shortness of Breath and Swelling of Extremities. Gastrointestinal Not Present- Abdominal Pain, Bloating, Bloody Stool, Change in Bowel Habits, Chronic diarrhea, Constipation, Difficulty Swallowing, Excessive gas, Gets full quickly at meals, Hemorrhoids, Indigestion, Nausea, Rectal Pain and Vomiting. Female Genitourinary Not Present- Frequency, Nocturia, Painful Urination, Pelvic Pain and Urgency. Musculoskeletal Not Present- Back Pain, Joint Pain, Joint Stiffness, Muscle Pain, Muscle Weakness and Swelling of Extremities. Neurological Not Present- Decreased Memory, Fainting, Headaches, Numbness, Seizures, Tingling, Tremor, Trouble walking and Weakness. Psychiatric Not Present- Anxiety, Bipolar, Change in Sleep Pattern, Depression, Fearful and Frequent crying. Endocrine Not Present- Cold Intolerance, Excessive Hunger, Hair Changes, Heat Intolerance, Hot flashes and New Diabetes. Hematology Present- Easy Bruising. Not Present- Blood Thinners, Excessive bleeding, Gland problems, HIV and Persistent Infections.  Vitals:   07/21/16 0917  BP: (!) 163/58  Pulse:  68  Resp: 20  Temp: 98.8 F (37.1 C)        Physical Exam   The physical exam findings are as follows: Note:AOx3, well apeparing, husband here with her. EOMI, PERRL, MMM Neck no mass or thyromegaly Unlabored resp, RRR Abdomen soft, nontender, nondistended. Vaguely  palpable LLQ hernia with bowel, several inches above and lateral to jer very low pfannenstiel scar Ext WWP Neuro grossly intact Psych normal mood and affect    Assessment & Plan   HERNIA (Principal Diagnosis) (K46.9) Story: I suspect she has a spigelian hernia given the location and no prior surgery in the area. Ct consistent. Plan for laparoscopic repair with mesh. Possible open.

## 2016-07-21 NOTE — Anesthesia Postprocedure Evaluation (Signed)
Anesthesia Post Note  Patient: Kylie Flowers  Procedure(s) Performed: Procedure(s) (LRB): INSERTION OF MESH (N/A) LAPAROSCOPIC INDIRECT INGUINAL HERNIA (Left)  Patient location during evaluation: PACU Anesthesia Type: General Level of consciousness: awake, awake and alert and oriented Pain management: pain level controlled Vital Signs Assessment: post-procedure vital signs reviewed and stable Respiratory status: spontaneous breathing, nonlabored ventilation and respiratory function stable Cardiovascular status: blood pressure returned to baseline    Last Vitals:  Vitals:   07/21/16 1231 07/21/16 1244  BP: (!) 128/53 (!) 136/54  Pulse: 70 69  Resp: 10 12  Temp:      Last Pain:  Vitals:   07/21/16 1244  TempSrc:   PainSc: 0-No pain                 Tauri Ethington COKER

## 2016-07-21 NOTE — Transfer of Care (Signed)
Immediate Anesthesia Transfer of Care Note  Patient: Kylie Flowers  Procedure(s) Performed: Procedure(s): INSERTION OF MESH (N/A) LAPAROSCOPIC INDIRECT INGUINAL HERNIA (Left)  Patient Location: PACU  Anesthesia Type:General  Level of Consciousness: awake, alert  and oriented  Airway & Oxygen Therapy: Patient Spontanous Breathing  Post-op Assessment: Report given to RN, Post -op Vital signs reviewed and stable and Patient moving all extremities X 4  Post vital signs: Reviewed and stable  Last Vitals:  Vitals:   07/21/16 0917 07/21/16 1216  BP: (!) 163/58   Pulse: 68   Resp: 20   Temp: 37.1 C 36.4 C    Last Pain:  Vitals:   07/21/16 0917  TempSrc: Oral      Patients Stated Pain Goal: 4 ([id number])  Complications: No apparent anesthesia complications

## 2016-07-21 NOTE — Anesthesia Preprocedure Evaluation (Addendum)
Anesthesia Evaluation  Patient identified by MRN, date of birth, ID band Patient awake    Reviewed: Allergy & Precautions, NPO status , Patient's Chart, lab work & pertinent test results  Airway Mallampati: II  TM Distance: >3 FB Neck ROM: Full    Dental  (+) Teeth Intact, Dental Advisory Given   Pulmonary Current Smoker,    breath sounds clear to auscultation       Cardiovascular hypertension,  Rhythm:Regular Rate:Normal     Neuro/Psych    GI/Hepatic   Endo/Other    Renal/GU      Musculoskeletal   Abdominal   Peds  Hematology   Anesthesia Other Findings   Reproductive/Obstetrics                           Anesthesia Physical Anesthesia Plan  ASA: II  Anesthesia Plan: General   Post-op Pain Management:    Induction: Intravenous  Airway Management Planned: Oral ETT  Additional Equipment:   Intra-op Plan:   Post-operative Plan: Extubation in OR  Informed Consent: I have reviewed the patients History and Physical, chart, labs and discussed the procedure including the risks, benefits and alternatives for the proposed anesthesia with the patient or authorized representative who has indicated his/her understanding and acceptance.   Dental advisory given  Plan Discussed with: CRNA and Anesthesiologist  Anesthesia Plan Comments:         Anesthesia Quick Evaluation

## 2016-07-24 ENCOUNTER — Encounter (HOSPITAL_COMMUNITY): Payer: Self-pay | Admitting: Surgery

## 2016-08-29 DIAGNOSIS — H6123 Impacted cerumen, bilateral: Secondary | ICD-10-CM | POA: Diagnosis not present

## 2016-08-29 DIAGNOSIS — H9042 Sensorineural hearing loss, unilateral, left ear, with unrestricted hearing on the contralateral side: Secondary | ICD-10-CM | POA: Diagnosis not present

## 2016-08-29 DIAGNOSIS — H9312 Tinnitus, left ear: Secondary | ICD-10-CM | POA: Diagnosis not present

## 2016-10-03 DIAGNOSIS — R7309 Other abnormal glucose: Secondary | ICD-10-CM | POA: Diagnosis not present

## 2016-10-03 DIAGNOSIS — E785 Hyperlipidemia, unspecified: Secondary | ICD-10-CM | POA: Diagnosis not present

## 2016-10-03 DIAGNOSIS — N182 Chronic kidney disease, stage 2 (mild): Secondary | ICD-10-CM | POA: Diagnosis not present

## 2016-10-03 DIAGNOSIS — M069 Rheumatoid arthritis, unspecified: Secondary | ICD-10-CM | POA: Diagnosis not present

## 2017-01-02 DIAGNOSIS — Z79899 Other long term (current) drug therapy: Secondary | ICD-10-CM | POA: Diagnosis not present

## 2017-01-02 DIAGNOSIS — M810 Age-related osteoporosis without current pathological fracture: Secondary | ICD-10-CM | POA: Diagnosis not present

## 2017-01-02 DIAGNOSIS — I10 Essential (primary) hypertension: Secondary | ICD-10-CM | POA: Diagnosis not present

## 2017-01-02 DIAGNOSIS — M059 Rheumatoid arthritis with rheumatoid factor, unspecified: Secondary | ICD-10-CM | POA: Diagnosis not present

## 2017-01-18 DIAGNOSIS — R7309 Other abnormal glucose: Secondary | ICD-10-CM | POA: Diagnosis not present

## 2017-01-18 DIAGNOSIS — Z0000 Encounter for general adult medical examination without abnormal findings: Secondary | ICD-10-CM | POA: Diagnosis not present

## 2017-01-25 DIAGNOSIS — J449 Chronic obstructive pulmonary disease, unspecified: Secondary | ICD-10-CM | POA: Diagnosis not present

## 2017-01-25 DIAGNOSIS — Z23 Encounter for immunization: Secondary | ICD-10-CM | POA: Diagnosis not present

## 2017-01-25 DIAGNOSIS — N182 Chronic kidney disease, stage 2 (mild): Secondary | ICD-10-CM | POA: Diagnosis not present

## 2017-01-25 DIAGNOSIS — Z0000 Encounter for general adult medical examination without abnormal findings: Secondary | ICD-10-CM | POA: Diagnosis not present

## 2017-01-25 DIAGNOSIS — M810 Age-related osteoporosis without current pathological fracture: Secondary | ICD-10-CM | POA: Diagnosis not present

## 2017-03-26 DIAGNOSIS — K439 Ventral hernia without obstruction or gangrene: Secondary | ICD-10-CM | POA: Diagnosis not present

## 2017-03-26 DIAGNOSIS — M0540 Rheumatoid myopathy with rheumatoid arthritis of unspecified site: Secondary | ICD-10-CM | POA: Diagnosis not present

## 2017-03-26 DIAGNOSIS — Z86010 Personal history of colonic polyps: Secondary | ICD-10-CM | POA: Diagnosis not present

## 2017-03-29 DIAGNOSIS — M545 Low back pain: Secondary | ICD-10-CM | POA: Diagnosis not present

## 2017-03-29 DIAGNOSIS — M4316 Spondylolisthesis, lumbar region: Secondary | ICD-10-CM | POA: Diagnosis not present

## 2017-03-29 DIAGNOSIS — M4126 Other idiopathic scoliosis, lumbar region: Secondary | ICD-10-CM | POA: Diagnosis not present

## 2017-04-10 DIAGNOSIS — M4316 Spondylolisthesis, lumbar region: Secondary | ICD-10-CM | POA: Diagnosis not present

## 2017-04-12 DIAGNOSIS — M4316 Spondylolisthesis, lumbar region: Secondary | ICD-10-CM | POA: Diagnosis not present

## 2017-04-16 DIAGNOSIS — M4316 Spondylolisthesis, lumbar region: Secondary | ICD-10-CM | POA: Diagnosis not present

## 2017-04-19 DIAGNOSIS — M4316 Spondylolisthesis, lumbar region: Secondary | ICD-10-CM | POA: Diagnosis not present

## 2017-04-26 DIAGNOSIS — H9042 Sensorineural hearing loss, unilateral, left ear, with unrestricted hearing on the contralateral side: Secondary | ICD-10-CM | POA: Diagnosis not present

## 2017-04-26 DIAGNOSIS — H9312 Tinnitus, left ear: Secondary | ICD-10-CM | POA: Diagnosis not present

## 2017-04-26 DIAGNOSIS — H6123 Impacted cerumen, bilateral: Secondary | ICD-10-CM | POA: Diagnosis not present

## 2017-04-27 DIAGNOSIS — M4316 Spondylolisthesis, lumbar region: Secondary | ICD-10-CM | POA: Diagnosis not present

## 2017-05-01 DIAGNOSIS — K5730 Diverticulosis of large intestine without perforation or abscess without bleeding: Secondary | ICD-10-CM | POA: Diagnosis not present

## 2017-05-01 DIAGNOSIS — D127 Benign neoplasm of rectosigmoid junction: Secondary | ICD-10-CM | POA: Diagnosis not present

## 2017-05-01 DIAGNOSIS — K635 Polyp of colon: Secondary | ICD-10-CM | POA: Diagnosis not present

## 2017-05-01 DIAGNOSIS — Z86010 Personal history of colonic polyps: Secondary | ICD-10-CM | POA: Diagnosis not present

## 2017-05-01 DIAGNOSIS — D122 Benign neoplasm of ascending colon: Secondary | ICD-10-CM | POA: Diagnosis not present

## 2017-05-01 DIAGNOSIS — K621 Rectal polyp: Secondary | ICD-10-CM | POA: Diagnosis not present

## 2017-05-18 DIAGNOSIS — Z23 Encounter for immunization: Secondary | ICD-10-CM | POA: Diagnosis not present

## 2017-07-25 DIAGNOSIS — M059 Rheumatoid arthritis with rheumatoid factor, unspecified: Secondary | ICD-10-CM | POA: Diagnosis not present

## 2017-07-25 DIAGNOSIS — Z79899 Other long term (current) drug therapy: Secondary | ICD-10-CM | POA: Diagnosis not present

## 2017-08-28 DIAGNOSIS — M810 Age-related osteoporosis without current pathological fracture: Secondary | ICD-10-CM | POA: Diagnosis not present

## 2017-08-28 DIAGNOSIS — M059 Rheumatoid arthritis with rheumatoid factor, unspecified: Secondary | ICD-10-CM | POA: Diagnosis not present

## 2017-08-28 DIAGNOSIS — I10 Essential (primary) hypertension: Secondary | ICD-10-CM | POA: Diagnosis not present

## 2017-08-28 DIAGNOSIS — Z79899 Other long term (current) drug therapy: Secondary | ICD-10-CM | POA: Diagnosis not present

## 2017-12-06 DIAGNOSIS — M810 Age-related osteoporosis without current pathological fracture: Secondary | ICD-10-CM | POA: Diagnosis not present

## 2017-12-06 DIAGNOSIS — Z79899 Other long term (current) drug therapy: Secondary | ICD-10-CM | POA: Diagnosis not present

## 2017-12-06 DIAGNOSIS — M059 Rheumatoid arthritis with rheumatoid factor, unspecified: Secondary | ICD-10-CM | POA: Diagnosis not present

## 2017-12-27 DIAGNOSIS — H9312 Tinnitus, left ear: Secondary | ICD-10-CM | POA: Diagnosis not present

## 2017-12-27 DIAGNOSIS — H6123 Impacted cerumen, bilateral: Secondary | ICD-10-CM | POA: Diagnosis not present

## 2017-12-27 DIAGNOSIS — H9042 Sensorineural hearing loss, unilateral, left ear, with unrestricted hearing on the contralateral side: Secondary | ICD-10-CM | POA: Diagnosis not present

## 2018-01-02 ENCOUNTER — Other Ambulatory Visit: Payer: Self-pay | Admitting: Otolaryngology

## 2018-01-02 DIAGNOSIS — H9042 Sensorineural hearing loss, unilateral, left ear, with unrestricted hearing on the contralateral side: Secondary | ICD-10-CM

## 2018-01-02 DIAGNOSIS — IMO0001 Reserved for inherently not codable concepts without codable children: Secondary | ICD-10-CM

## 2018-01-04 ENCOUNTER — Other Ambulatory Visit: Payer: Medicare Other

## 2018-01-11 ENCOUNTER — Ambulatory Visit
Admission: RE | Admit: 2018-01-11 | Discharge: 2018-01-11 | Disposition: A | Discharge status: Home / Self Care | Payer: PPO | Source: Ambulatory Visit | Attending: Otolaryngology | Admitting: Otolaryngology

## 2018-01-11 DIAGNOSIS — H9312 Tinnitus, left ear: Secondary | ICD-10-CM | POA: Diagnosis not present

## 2018-01-11 DIAGNOSIS — H9042 Sensorineural hearing loss, unilateral, left ear, with unrestricted hearing on the contralateral side: Secondary | ICD-10-CM

## 2018-01-11 DIAGNOSIS — IMO0001 Reserved for inherently not codable concepts without codable children: Secondary | ICD-10-CM

## 2018-01-11 MED ORDER — GADOBENATE DIMEGLUMINE 529 MG/ML IV SOLN
10.0000 mL | Freq: Once | INTRAVENOUS | Status: AC | PRN
  Administered 2018-01-11: 10 mL via INTRAVENOUS

## 2018-01-23 DIAGNOSIS — N390 Urinary tract infection, site not specified: Secondary | ICD-10-CM | POA: Diagnosis not present

## 2018-01-23 DIAGNOSIS — R7309 Other abnormal glucose: Secondary | ICD-10-CM | POA: Diagnosis not present

## 2018-01-23 DIAGNOSIS — E559 Vitamin D deficiency, unspecified: Secondary | ICD-10-CM | POA: Diagnosis not present

## 2018-01-23 DIAGNOSIS — Z0000 Encounter for general adult medical examination without abnormal findings: Secondary | ICD-10-CM | POA: Diagnosis not present

## 2018-01-23 DIAGNOSIS — I10 Essential (primary) hypertension: Secondary | ICD-10-CM | POA: Diagnosis not present

## 2018-01-29 DIAGNOSIS — Z0000 Encounter for general adult medical examination without abnormal findings: Secondary | ICD-10-CM | POA: Diagnosis not present

## 2018-01-29 DIAGNOSIS — M8580 Other specified disorders of bone density and structure, unspecified site: Secondary | ICD-10-CM | POA: Diagnosis not present

## 2018-01-29 DIAGNOSIS — N390 Urinary tract infection, site not specified: Secondary | ICD-10-CM | POA: Diagnosis not present

## 2018-01-29 DIAGNOSIS — Z01419 Encounter for gynecological examination (general) (routine) without abnormal findings: Secondary | ICD-10-CM | POA: Diagnosis not present

## 2018-03-18 DIAGNOSIS — H04123 Dry eye syndrome of bilateral lacrimal glands: Secondary | ICD-10-CM | POA: Diagnosis not present

## 2018-03-18 DIAGNOSIS — H26493 Other secondary cataract, bilateral: Secondary | ICD-10-CM | POA: Diagnosis not present

## 2018-03-18 DIAGNOSIS — H43812 Vitreous degeneration, left eye: Secondary | ICD-10-CM | POA: Diagnosis not present

## 2018-03-27 DIAGNOSIS — M810 Age-related osteoporosis without current pathological fracture: Secondary | ICD-10-CM | POA: Diagnosis not present

## 2018-03-27 DIAGNOSIS — Z111 Encounter for screening for respiratory tuberculosis: Secondary | ICD-10-CM | POA: Diagnosis not present

## 2018-05-09 DIAGNOSIS — Z23 Encounter for immunization: Secondary | ICD-10-CM | POA: Diagnosis not present

## 2018-05-20 DIAGNOSIS — M059 Rheumatoid arthritis with rheumatoid factor, unspecified: Secondary | ICD-10-CM | POA: Diagnosis not present

## 2018-05-20 DIAGNOSIS — Z79899 Other long term (current) drug therapy: Secondary | ICD-10-CM | POA: Diagnosis not present

## 2018-05-20 DIAGNOSIS — M810 Age-related osteoporosis without current pathological fracture: Secondary | ICD-10-CM | POA: Diagnosis not present

## 2018-06-10 ENCOUNTER — Other Ambulatory Visit: Payer: Self-pay

## 2018-06-10 NOTE — Patient Outreach (Signed)
[facility]) Care Management  06/10/2018  Kylie Flowers 1945-08-26 [id number]   Telephone Screen  Referral Date: 06/07/18 Referral Source: Nurse Call Center Referral Reason: " 06/07/18-2:06pm-caller states her and her husband had mumps as kids and she would like to know if they should get MMR vaccine" Insurance: H[employer]   Outreach attempt # 1 to patient. Spoke with patient and discussed recent call to nurse line. She voices that the nurse she spoke with gave her some helpful information and advised patient to follow up with PCP. She voices that she has already contacted PCP an left message regarding the issue. She is awaiting a return call. She denies needing any other assistance or having any further needs or concerns at this time. She was advised to feel free to call 24/7 nurse line for any future needs or concerns. She voiced understanding and was appreciative of follow up call.      Plan: RN CM will close case at this time as no further interventions needed.    Enzo Montgomery, RN,BSN,CCM [place] Management Telephonic Care Management Coordinator Direct Phone: [phone] Toll Free: [phone] Fax: [phone]

## 2018-06-18 NOTE — Telephone Encounter (Signed)
This encounter was created in error - please disregard.

## 2018-08-06 DIAGNOSIS — H26492 Other secondary cataract, left eye: Secondary | ICD-10-CM | POA: Diagnosis not present

## 2018-08-06 DIAGNOSIS — H02831 Dermatochalasis of right upper eyelid: Secondary | ICD-10-CM | POA: Diagnosis not present

## 2018-08-06 DIAGNOSIS — H26493 Other secondary cataract, bilateral: Secondary | ICD-10-CM | POA: Diagnosis not present

## 2018-08-06 DIAGNOSIS — H18413 Arcus senilis, bilateral: Secondary | ICD-10-CM | POA: Diagnosis not present

## 2018-08-06 DIAGNOSIS — Z961 Presence of intraocular lens: Secondary | ICD-10-CM | POA: Diagnosis not present

## 2018-08-21 DIAGNOSIS — M21622 Bunionette of left foot: Secondary | ICD-10-CM | POA: Diagnosis not present

## 2018-08-21 DIAGNOSIS — M21621 Bunionette of right foot: Secondary | ICD-10-CM | POA: Insufficient documentation

## 2018-08-21 DIAGNOSIS — M205X1 Other deformities of toe(s) (acquired), right foot: Secondary | ICD-10-CM | POA: Diagnosis not present

## 2018-08-21 DIAGNOSIS — M2032 Hallux varus (acquired), left foot: Secondary | ICD-10-CM | POA: Diagnosis not present

## 2018-08-26 DIAGNOSIS — H26491 Other secondary cataract, right eye: Secondary | ICD-10-CM | POA: Diagnosis not present

## 2018-08-27 DIAGNOSIS — H26491 Other secondary cataract, right eye: Secondary | ICD-10-CM | POA: Diagnosis not present

## 2018-09-23 DIAGNOSIS — Z79899 Other long term (current) drug therapy: Secondary | ICD-10-CM | POA: Diagnosis not present

## 2018-09-23 DIAGNOSIS — M059 Rheumatoid arthritis with rheumatoid factor, unspecified: Secondary | ICD-10-CM | POA: Diagnosis not present

## 2018-09-23 DIAGNOSIS — M810 Age-related osteoporosis without current pathological fracture: Secondary | ICD-10-CM | POA: Diagnosis not present

## 2018-09-23 DIAGNOSIS — M79642 Pain in left hand: Secondary | ICD-10-CM | POA: Diagnosis not present

## 2018-09-23 DIAGNOSIS — M19042 Primary osteoarthritis, left hand: Secondary | ICD-10-CM | POA: Diagnosis not present

## 2018-09-23 DIAGNOSIS — M79641 Pain in right hand: Secondary | ICD-10-CM | POA: Diagnosis not present

## 2018-12-29 IMAGING — MR MR HEAD WO/W CM
14 series · 44 of 48 positions shown · IV contrast (12ml multihance)
Comparison: None.

CLINICAL DATA: Tinnitus and hearing loss on the left, 2 years
duration.

Creatinine was obtained on site at [HOSPITAL] at [HOSPITAL].
Results: Creatinine 0.5 mg/dL.
EXAM:
MRI HEAD WITHOUT AND WITH CONTRAST
TECHNIQUE: Multiplanar, multiecho pulse sequences of the brain and surrounding
structures were obtained without and with intravenous contrast.
CONTRAST:  10mL MULTIHANCE GADOBENATE DIMEGLUMINE 529 MG/ML IV SOLN

[Series 3: T2 · axial · 5.0mm · 0.45mm/px · 1 of 23 slices shown]
[im 1/23]
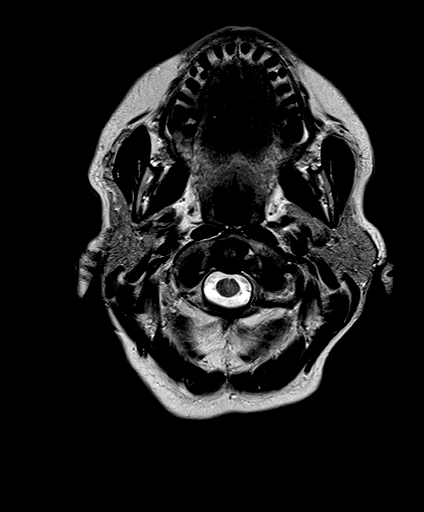

[Series 4: DWI · axial · 3.0mm · 1.80mm/px · z∈[-43,+104]mm · 8 of 97 slices shown (1 of 4)]
[im 1/97]
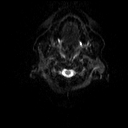
[im 14/97]
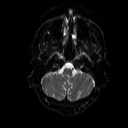
[im 28/97]
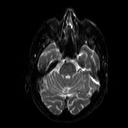
[im 42/97]
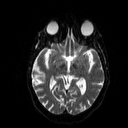
[im 55/97]
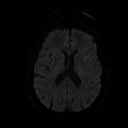
[im 69/97]
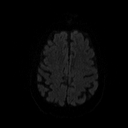
[im 83/97]
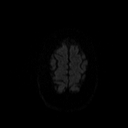
[im 97/97]
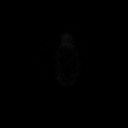

[Series 5: DWI · axial · 3.0mm · 1.80mm/px · z∈[-43,+104]mm · 4 of 50 slices shown (2 of 4)]
[im 1/50]
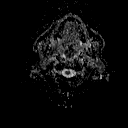
[im 17/50]
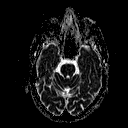
[im 33/50]
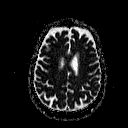
[im 50/50]
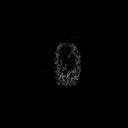

[Series 6: DWI · coronal · 5.0mm · 1.80mm/px · 6 of 71 slices shown (3 of 4)]
[im 1/71]
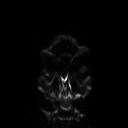
[im 15/71]
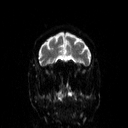
[im 29/71]
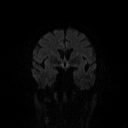
[im 43/71]
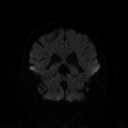
[im 57/71]
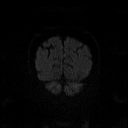
[im 71/71]
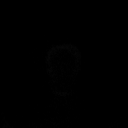

[Series 7: DWI · coronal · 5.0mm · 1.80mm/px · 3 of 36 slices shown (4 of 4)]
[im 1/36]
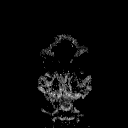
[im 18/36]
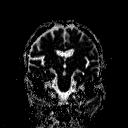
[im 36/36]
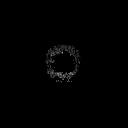

[Series 8: FLAIR · axial · 3.0mm · 0.45mm/px · z∈[-43,+101]mm · 3 of 32 slices shown]
[im 1/32]
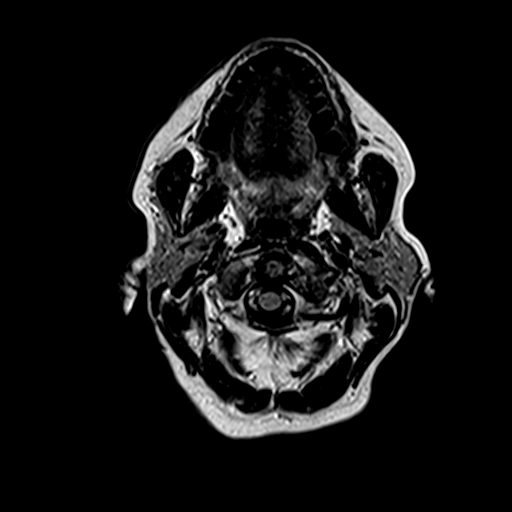
[im 16/32]
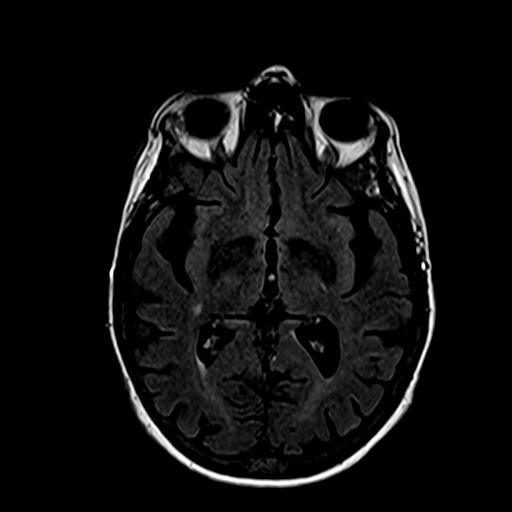
[im 32/32]
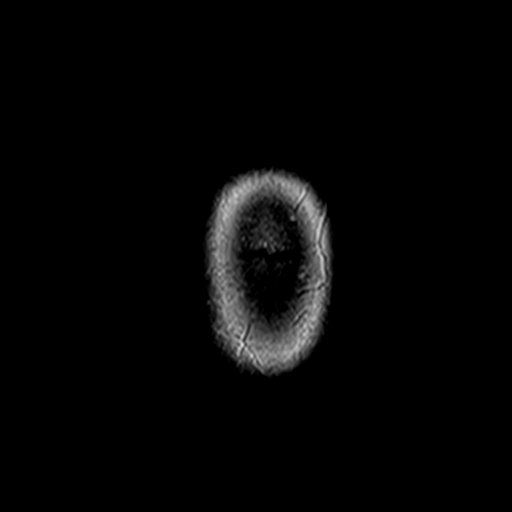

[Series 10: swi_images · axial · 2.0mm · 0.90mm/px · z∈[-46,+112]mm · 7 of 80 slices shown]
[im 1/80]
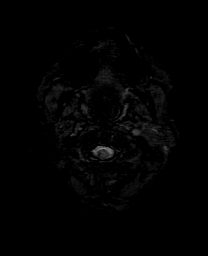
[im 14/80]
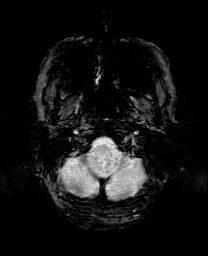
[im 27/80]
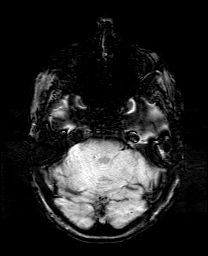
[im 40/80]
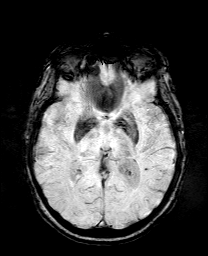
[im 53/80]
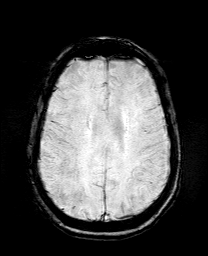
[im 66/80]
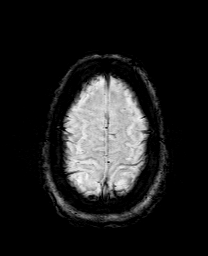
[im 80/80]
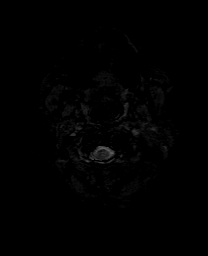

[Series 11: T1 · coronal · 3.0mm · 0.35mm/px · 1 of 14 slices shown (1 of 3)]
[im 1/14]
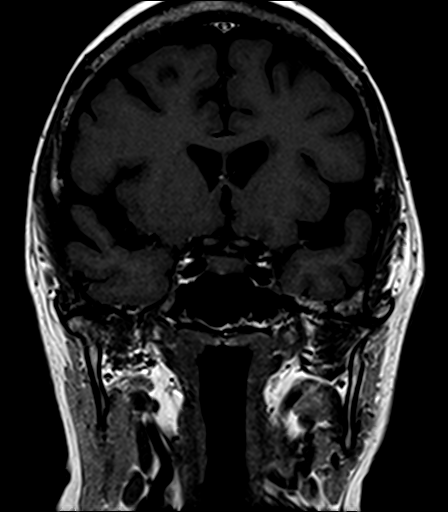

[Series 12: T1 · axial · 3.0mm · 0.35mm/px · 1 of 14 slices shown (2 of 3)]
[im 1/14]
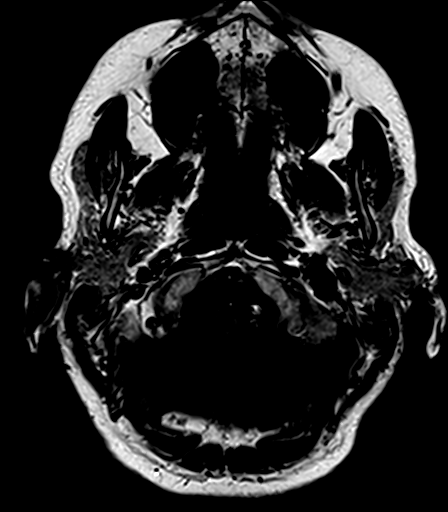

[Series 13: bSSFP · axial · 1.0mm · 0.30mm/px · z∈[-19,+24]mm · 4 of 44 slices shown]
[im 1/44]
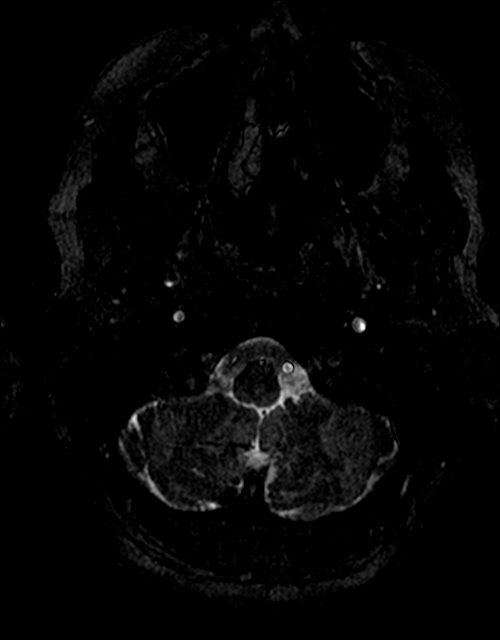
[im 15/44]
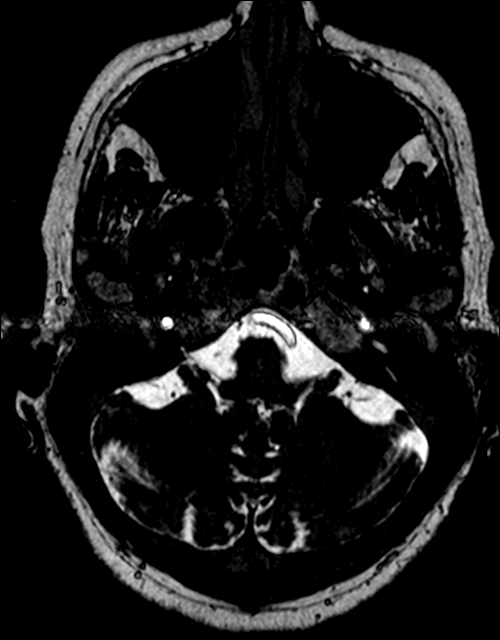
[im 29/44]
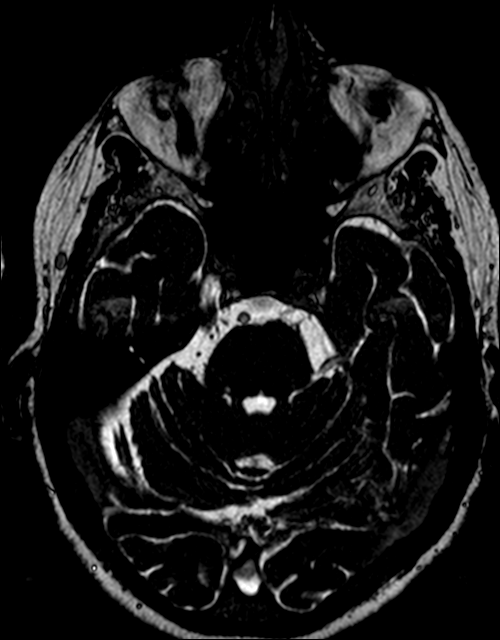
[im 44/44]
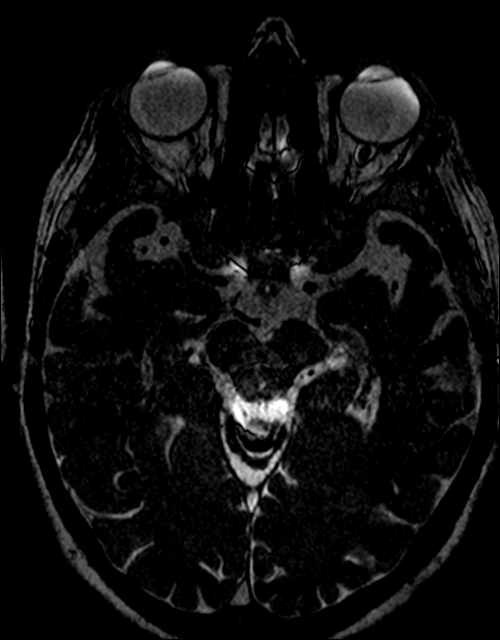

[Series 14: T1 · sagittal · 5.0mm · 0.45mm/px · 2 of 25 slices shown (3 of 3)]
[im 1/25]
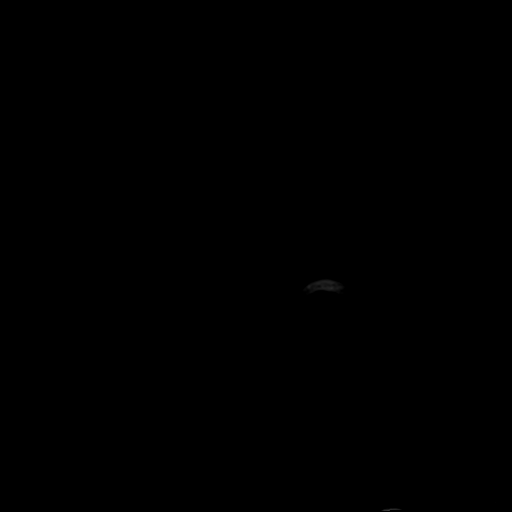
[im 25/25]
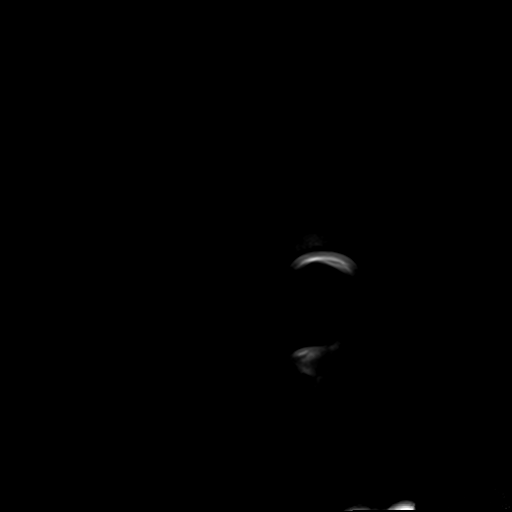

[Series 15: T1 post-contrast · coronal · 3.0mm · 0.35mm/px · 1 of 14 slices shown (1 of 2)]
[im 1/14]
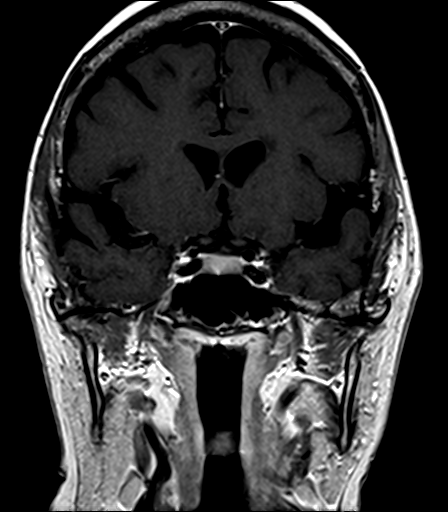

[Series 16: T1 post-contrast · axial · 3.0mm · 0.35mm/px · 1 of 14 slices shown (2 of 2)]
[im 1/14]
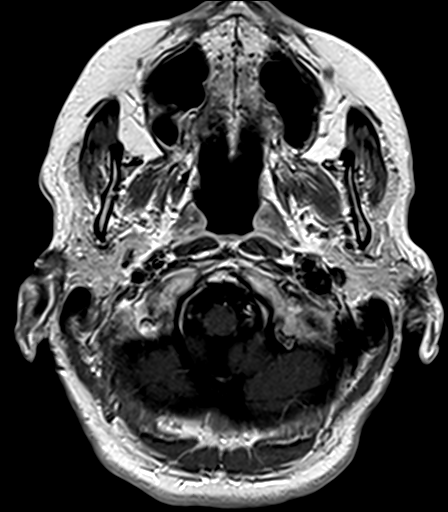

[Series 17: post_t1_mpr_tra · axial · 2.0mm · 0.45mm/px · z∈[-35,-7]mm · 2 of 72 slices shown]
[im 1/72]
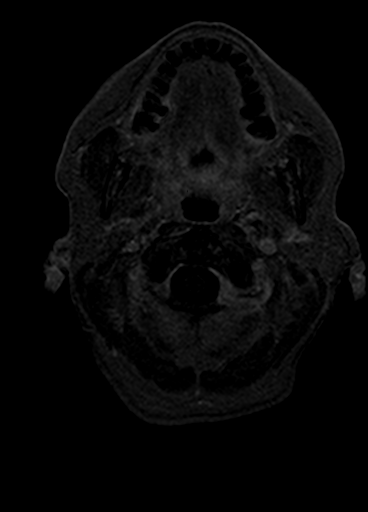
[im 15/72]
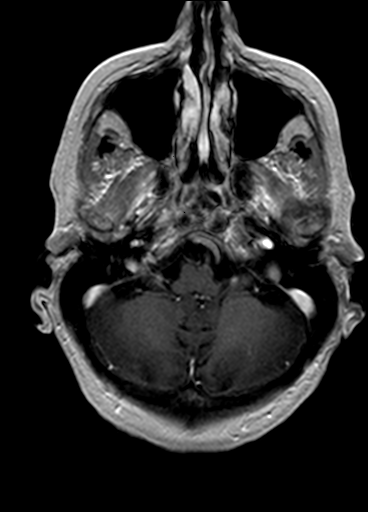

[44 of 48 positions shown; findings below may reference images not displayed]

FINDINGS: Brain: Diffusion imaging does not show any acute or subacute
infarction. There are minimal small vessel changes of the pons. No
cerebellar insult. CP angle regions are normal. No vestibular
schwannoma. No enhancing neuritis. Inner ear structures appear
normal. Cerebral hemispheres show mild changes of chronic small
vessel disease of the white matter. No cortical or large vessel
territory infarction. No mass lesion, hemorrhage, hydrocephalus or
extra-axial collection. No abnormal brain or leptomeningeal
enhancement.

Vascular: Major vessels at the base of the brain show flow. Right
vertebral artery is a tiny vessel that serves PICA.

Skull and upper cervical spine: Normal

Sinuses/Orbits: Clear/normal

Other: None
IMPRESSION: No cause of the presenting symptoms is identified.

Mild chronic small-vessel ischemic change of the pons and cerebral
hemispheric white matter.

## 2019-01-08 DIAGNOSIS — M810 Age-related osteoporosis without current pathological fracture: Secondary | ICD-10-CM | POA: Diagnosis not present

## 2019-01-08 DIAGNOSIS — M059 Rheumatoid arthritis with rheumatoid factor, unspecified: Secondary | ICD-10-CM | POA: Diagnosis not present

## 2019-01-08 DIAGNOSIS — Z79899 Other long term (current) drug therapy: Secondary | ICD-10-CM | POA: Diagnosis not present

## 2019-01-27 DIAGNOSIS — E559 Vitamin D deficiency, unspecified: Secondary | ICD-10-CM | POA: Diagnosis not present

## 2019-01-27 DIAGNOSIS — R7309 Other abnormal glucose: Secondary | ICD-10-CM | POA: Diagnosis not present

## 2019-01-27 DIAGNOSIS — Z0000 Encounter for general adult medical examination without abnormal findings: Secondary | ICD-10-CM | POA: Diagnosis not present

## 2019-02-06 DIAGNOSIS — Z0000 Encounter for general adult medical examination without abnormal findings: Secondary | ICD-10-CM | POA: Diagnosis not present

## 2019-02-06 DIAGNOSIS — E559 Vitamin D deficiency, unspecified: Secondary | ICD-10-CM | POA: Diagnosis not present

## 2019-02-06 DIAGNOSIS — Z01419 Encounter for gynecological examination (general) (routine) without abnormal findings: Secondary | ICD-10-CM | POA: Diagnosis not present

## 2019-02-06 DIAGNOSIS — E785 Hyperlipidemia, unspecified: Secondary | ICD-10-CM | POA: Diagnosis not present

## 2019-02-19 DIAGNOSIS — M79671 Pain in right foot: Secondary | ICD-10-CM | POA: Insufficient documentation

## 2019-03-07 DIAGNOSIS — M79671 Pain in right foot: Secondary | ICD-10-CM | POA: Diagnosis not present

## 2019-03-07 DIAGNOSIS — M21621 Bunionette of right foot: Secondary | ICD-10-CM | POA: Diagnosis not present

## 2019-03-07 DIAGNOSIS — L97512 Non-pressure chronic ulcer of other part of right foot with fat layer exposed: Secondary | ICD-10-CM | POA: Diagnosis not present

## 2019-03-18 DIAGNOSIS — M79671 Pain in right foot: Secondary | ICD-10-CM | POA: Diagnosis not present

## 2019-03-18 DIAGNOSIS — M21621 Bunionette of right foot: Secondary | ICD-10-CM | POA: Diagnosis not present

## 2019-03-18 DIAGNOSIS — L97512 Non-pressure chronic ulcer of other part of right foot with fat layer exposed: Secondary | ICD-10-CM | POA: Diagnosis not present

## 2019-03-25 DIAGNOSIS — M79671 Pain in right foot: Secondary | ICD-10-CM | POA: Diagnosis not present

## 2019-04-07 DIAGNOSIS — L97511 Non-pressure chronic ulcer of other part of right foot limited to breakdown of skin: Secondary | ICD-10-CM | POA: Diagnosis not present

## 2019-04-07 DIAGNOSIS — L97519 Non-pressure chronic ulcer of other part of right foot with unspecified severity: Secondary | ICD-10-CM | POA: Insufficient documentation

## 2019-04-10 ENCOUNTER — Telehealth (HOSPITAL_COMMUNITY): Payer: Self-pay | Admitting: Rehabilitation

## 2019-04-10 NOTE — Telephone Encounter (Signed)

## 2019-04-11 ENCOUNTER — Other Ambulatory Visit: Payer: Self-pay | Admitting: *Deleted

## 2019-04-11 ENCOUNTER — Ambulatory Visit (INDEPENDENT_AMBULATORY_CARE_PROVIDER_SITE_OTHER): Payer: PPO | Admitting: Vascular Surgery

## 2019-04-11 ENCOUNTER — Other Ambulatory Visit (HOSPITAL_COMMUNITY): Payer: Self-pay | Admitting: Vascular Surgery

## 2019-04-11 ENCOUNTER — Encounter: Payer: Self-pay | Admitting: Vascular Surgery

## 2019-04-11 ENCOUNTER — Encounter: Payer: Self-pay | Admitting: *Deleted

## 2019-04-11 ENCOUNTER — Ambulatory Visit (INDEPENDENT_AMBULATORY_CARE_PROVIDER_SITE_OTHER)
Admission: RE | Admit: 2019-04-11 | Discharge: 2019-04-11 | Disposition: A | Discharge status: Home / Self Care | Payer: PPO | Source: Ambulatory Visit | Attending: Vascular Surgery | Admitting: Vascular Surgery

## 2019-04-11 ENCOUNTER — Other Ambulatory Visit: Payer: Self-pay

## 2019-04-11 ENCOUNTER — Ambulatory Visit (HOSPITAL_COMMUNITY)
Admission: RE | Admit: 2019-04-11 | Discharge: 2019-04-11 | Disposition: A | Discharge status: Home / Self Care | Payer: PPO | Source: Ambulatory Visit | Attending: Family | Admitting: Family

## 2019-04-11 VITALS — BP 192/81 | HR 58 | Temp 97.4°F | Resp 20 | Ht 62.0 in | Wt 129.7 lb

## 2019-04-11 DIAGNOSIS — I739 Peripheral vascular disease, unspecified: Secondary | ICD-10-CM

## 2019-04-11 MED ORDER — CLOPIDOGREL BISULFATE 75 MG PO TABS: 75.0000 mg | ORAL_TABLET | Freq: Every day | ORAL | 11 refills | Status: DC

## 2019-04-11 NOTE — Progress Notes (Signed)
Patient ID: Kylie Flowers, female   DOB: Feb 10, 1946, 73 y.o.   MRN: [id number]  Reason for Consult: No chief complaint on file.   Referred by Wylene Simmer, MD  Subjective:     HPI:  Kylie Flowers is a 73 y.o. female without significant vascular disease.  Is a previous medical quit 20 years ago.  Also has hypertension.  Denies hyperlipidemia does not have any family history of vascular disease.  Is never had stroke TIA amaurosis.  Had a low-grade injury to her right small toe in July with 1 of her grandchildren.  This has been slow to heal.  She is now here for vascular evaluation.  Denies claudication.  Does not have any other previous wounds.  Past Medical History:  Diagnosis Date  . Anemia    in her teens  . Arthritis    Rheumatoid Arthritis  . Family history of adverse reaction to anesthesia    mom hallucinates after surgery  . Heart murmur    has had it "for years"  . Hypertension    Family History  Problem Relation Age of Onset  . Hypertension Mother   . Fibromyalgia Mother   . Lung cancer Mother   . Hypertension Father   . Lung cancer Father    Past Surgical History:  Procedure Laterality Date  . BUNIONECTOMY    . COLONOSCOPY    . DILATION AND CURETTAGE, DIAGNOSTIC / THERAPEUTIC    . EYE SURGERY Bilateral    cataract surgery with lens implant  . INGUINAL HERNIA REPAIR Left 07/21/2016   Procedure: LAPAROSCOPIC INDIRECT INGUINAL HERNIA;  Surgeon: Clovis Riley, MD;  Location: [facility];  Service: General;  Laterality: Left;  . INSERTION OF MESH N/A 07/21/2016   Procedure: INSERTION OF MESH;  Surgeon: Clovis Riley, MD;  Location: [place];  Service: General;  Laterality: N/A;  . TUBAL LIGATION      Short Social History:  Social History   Tobacco Use  . Smoking status: Former Research scientist (life sciences)  . Smokeless tobacco: Never Used  . Tobacco comment: maybe smokes 5 cigarettes a day  Substance Use Topics  . Alcohol use: Yes    Comment: occasional    Allergies   Allergen Reactions  . Penicillins     UNSPECIFIED REACTION   Has patient had a PCN reaction causing immediate rash, facial/tongue/throat swelling, SOB or lightheadedness with hypotension: Unknown Has patient had a PCN reaction causing severe rash involving mucus membranes or skin necrosis: No Has patient had a PCN reaction that required hospitalization No Has patient had a PCN reaction occurring within the last 10 years: No If all of the above answers are "NO", then may proceed with Cephalosporin use.     Current Outpatient Medications  Medication Sig Dispense Refill  . ASPIRIN 81 PO aspirin 81 mg tablet,delayed release  Take 1 tablet every day by oral route.    . folic acid (FOLVITE) 1 MG tablet Take 1 mg by mouth every evening.     . methotrexate (RHEUMATREX) 2.5 MG tablet Take 15 mg by mouth once a week. Caution:Chemotherapy. Protect from light.  Tuesdays    . predniSONE (DELTASONE) 1 MG tablet Take 4 mg by mouth every evening. Takes 4 tablets to equal; 4 mg  0   No current facility-administered medications for this visit.     Review of Systems  Constitutional:  Constitutional negative. HENT: HENT negative.  Eyes: Eyes negative.  Respiratory: Respiratory negative.  Cardiovascular: Cardiovascular negative.  GI: Gastrointestinal negative.  Musculoskeletal: Musculoskeletal negative.  Skin: Positive for wound.  Neurological: Neurological negative. Hematologic: Hematologic/lymphatic negative.  Psychiatric: Psychiatric negative.        Objective:  Objective   Vitals:   04/11/19 1002  BP: (!) 192/81  Pulse: (!) 58  Resp: 20  Temp: (!) 97.4 F (36.3 C)  SpO2: 100%  Weight: 129 lb 11.2 oz (58.8 kg)  Height: 5\' 2"  (1.575 m)   Body mass index is 23.72 kg/m.  Physical Exam Constitutional:      Appearance: Normal appearance.  HENT:     Head: Normocephalic.     Nose: Nose normal.  Eyes:     Extraocular Movements: Extraocular movements intact.     Pupils: Pupils  are equal, round, and reactive to light.  Neck:     Musculoskeletal: No muscular tenderness.     Vascular: No carotid bruit.  Cardiovascular:     Rate and Rhythm: Normal rate and regular rhythm.     Pulses:          Radial pulses are 2+ on the right side and 2+ on the left side.       Femoral pulses are 2+ on the right side and 2+ on the left side.      Popliteal pulses are 0 on the right side and 2+ on the left side.     Heart sounds: Normal heart sounds.  Pulmonary:     Effort: Pulmonary effort is normal.     Breath sounds: Normal breath sounds.  Abdominal:     General: Abdomen is flat.     Palpations: Abdomen is soft. There is no mass.  Musculoskeletal: Normal range of motion.  Skin:    Comments: Right small toe half centimeter ulceration on the medial aspect Right toes are purple-colored Left toes appear well perfused with brisk capillary refill  Neurological:     General: No focal deficit present.     Mental Status: She is alert.  Psychiatric:        Mood and Affect: Mood normal.        Behavior: Behavior normal.        Thought Content: Thought content normal.        Judgment: Judgment normal.     Data: I have independently interpreted her right lower extremity duplex which demonstrates triphasic waveforms to the level of the SFA proximal she is then occluded at the popliteal proximal with monophasic anterior tibial posterior tibial and peroneal signals distally.  I independently interpreted her ABIs which was 0.48 on the right and 1 on the left.  They are monophasic right and triphasic left.  Toe pressure on the right 43 and right 193.     Assessment/Plan:     73 year old female referred for evaluation of right small toe ulceration with presumed vascular disease.  ABIs as noted above.  I discussed with her the need for aortogram possible intervention on her right lower extremity which would require Plavix afterwards.  Options include stenting versus atherectomy versus  balloon angioplasty.  If no endovascular options exist will need bypass.  She will be at some risk of losing this toe she is followed by Dr. Doran Durand.  We will get her scheduled in the near future she should continue aspirin.     Waynetta Sandy MD [facility]

## 2019-04-17 ENCOUNTER — Other Ambulatory Visit (HOSPITAL_COMMUNITY)
Admission: RE | Admit: 2019-04-17 | Discharge: 2019-04-17 | Disposition: A | Discharge status: Home / Self Care | Payer: PPO | Source: Ambulatory Visit | Attending: Vascular Surgery | Admitting: Vascular Surgery

## 2019-04-17 DIAGNOSIS — Z20828 Contact with and (suspected) exposure to other viral communicable diseases: Secondary | ICD-10-CM | POA: Diagnosis not present

## 2019-04-17 DIAGNOSIS — Z01812 Encounter for preprocedural laboratory examination: Secondary | ICD-10-CM | POA: Insufficient documentation

## 2019-04-18 LAB — NOVEL CORONAVIRUS, NAA (HOSP ORDER, SEND-OUT TO REF LAB; TAT 18-24 HRS): SARS-CoV-2, NAA: NOT DETECTED

## 2019-04-21 ENCOUNTER — Ambulatory Visit (HOSPITAL_COMMUNITY)
Admission: RE | Admit: 2019-04-21 | Discharge: 2019-04-21 | Disposition: A | Discharge status: Home / Self Care | Payer: PPO | Attending: Vascular Surgery | Admitting: Vascular Surgery

## 2019-04-21 ENCOUNTER — Other Ambulatory Visit: Payer: Self-pay

## 2019-04-21 ENCOUNTER — Encounter (HOSPITAL_COMMUNITY)
Admission: RE | Disposition: A | Discharge status: Home / Self Care | Payer: Self-pay | Source: Home / Self Care | Attending: Vascular Surgery

## 2019-04-21 DIAGNOSIS — F17210 Nicotine dependence, cigarettes, uncomplicated: Secondary | ICD-10-CM | POA: Diagnosis not present

## 2019-04-21 DIAGNOSIS — L97519 Non-pressure chronic ulcer of other part of right foot with unspecified severity: Secondary | ICD-10-CM | POA: Diagnosis not present

## 2019-04-21 DIAGNOSIS — Z7982 Long term (current) use of aspirin: Secondary | ICD-10-CM | POA: Insufficient documentation

## 2019-04-21 DIAGNOSIS — Z79899 Other long term (current) drug therapy: Secondary | ICD-10-CM | POA: Insufficient documentation

## 2019-04-21 DIAGNOSIS — E785 Hyperlipidemia, unspecified: Secondary | ICD-10-CM | POA: Diagnosis not present

## 2019-04-21 DIAGNOSIS — I10 Essential (primary) hypertension: Secondary | ICD-10-CM | POA: Diagnosis not present

## 2019-04-21 DIAGNOSIS — I70235 Atherosclerosis of native arteries of right leg with ulceration of other part of foot: Secondary | ICD-10-CM | POA: Diagnosis not present

## 2019-04-21 DIAGNOSIS — M069 Rheumatoid arthritis, unspecified: Secondary | ICD-10-CM | POA: Insufficient documentation

## 2019-04-21 DIAGNOSIS — I70245 Atherosclerosis of native arteries of left leg with ulceration of other part of foot: Secondary | ICD-10-CM | POA: Diagnosis not present

## 2019-04-21 HISTORY — PX: PERIPHERAL VASCULAR INTERVENTION: CATH118257

## 2019-04-21 HISTORY — PX: ABDOMINAL AORTOGRAM W/LOWER EXTREMITY: CATH118223

## 2019-04-21 LAB — POCT I-STAT, CHEM 8
BUN: 21 mg/dL (ref 8–23)
Calcium, Ion: 1.17 mmol/L (ref 1.15–1.40)
Chloride: 109 mmol/L (ref 98–111)
Creatinine, Ser: 0.5 mg/dL (ref 0.44–1.00)
Glucose, Bld: 89 mg/dL (ref 70–99)
HCT: 39 % (ref 36.0–46.0)
Hemoglobin: 13.3 g/dL (ref 12.0–15.0)
Potassium: 3.9 mmol/L (ref 3.5–5.1)
Sodium: 144 mmol/L (ref 135–145)
TCO2: 22 mmol/L (ref 22–32)

## 2019-04-21 SURGERY — ABDOMINAL AORTOGRAM W/LOWER EXTREMITY
Anesthesia: LOCAL | Laterality: Right

## 2019-04-21 MED ORDER — MORPHINE SULFATE (PF) 2 MG/ML IV SOLN: 2.0000 mg | INTRAVENOUS | Status: DC | PRN

## 2019-04-21 MED ORDER — LIDOCAINE-EPINEPHRINE 1 %-1:100000 IJ SOLN
INTRAMUSCULAR | Status: DC | PRN
  Administered 2019-04-21: 5 mL

## 2019-04-21 MED ORDER — SODIUM CHLORIDE 0.9% FLUSH: 3.0000 mL | INTRAVENOUS | Status: DC | PRN

## 2019-04-21 MED ORDER — SODIUM CHLORIDE 0.9% FLUSH: 3.0000 mL | Freq: Two times a day (BID) | INTRAVENOUS | Status: DC

## 2019-04-21 MED ORDER — ACETAMINOPHEN 325 MG PO TABS: 650.0000 mg | ORAL_TABLET | ORAL | Status: DC | PRN

## 2019-04-21 MED ORDER — LIDOCAINE HCL (PF) 1 % IJ SOLN
INTRAMUSCULAR | Status: DC | PRN
  Administered 2019-04-21: 15 mL

## 2019-04-21 MED ORDER — HEPARIN (PORCINE) IN NACL 1000-0.9 UT/500ML-% IV SOLN
INTRAVENOUS | Status: AC
  Filled 2019-04-21: qty 1000

## 2019-04-21 MED ORDER — ROSUVASTATIN CALCIUM 10 MG PO TABS: 10.0000 mg | ORAL_TABLET | Freq: Every day | ORAL | Status: DC

## 2019-04-21 MED ORDER — HEPARIN SODIUM (PORCINE) 1000 UNIT/ML IJ SOLN
INTRAMUSCULAR | Status: AC
  Filled 2019-04-21: qty 1

## 2019-04-21 MED ORDER — ROSUVASTATIN CALCIUM 10 MG PO TABS: 10.0000 mg | ORAL_TABLET | Freq: Every day | ORAL | 11 refills | Status: DC

## 2019-04-21 MED ORDER — LIDOCAINE-EPINEPHRINE 1 %-1:100000 IJ SOLN
INTRAMUSCULAR | Status: AC
  Filled 2019-04-21: qty 1

## 2019-04-21 MED ORDER — SODIUM CHLORIDE 0.9 % IV SOLN
INTRAVENOUS | Status: DC
  Administered 2019-04-21: 11:00:00 via INTRAVENOUS

## 2019-04-21 MED ORDER — HYDRALAZINE HCL 20 MG/ML IJ SOLN: 5.0000 mg | INTRAMUSCULAR | Status: DC | PRN

## 2019-04-21 MED ORDER — HEPARIN (PORCINE) IN NACL 1000-0.9 UT/500ML-% IV SOLN
INTRAVENOUS | Status: DC | PRN
  Administered 2019-04-21 (×2): 500 mL

## 2019-04-21 MED ORDER — IODIXANOL 320 MG/ML IV SOLN
INTRAVENOUS | Status: DC | PRN
  Administered 2019-04-21: 150 mL via INTRA_ARTERIAL

## 2019-04-21 MED ORDER — HEPARIN SODIUM (PORCINE) 1000 UNIT/ML IJ SOLN
INTRAMUSCULAR | Status: DC | PRN
  Administered 2019-04-21: 6000 [IU] via INTRAVENOUS

## 2019-04-21 MED ORDER — SODIUM CHLORIDE 0.9 % IV SOLN: INTRAVENOUS | Status: DC

## 2019-04-21 MED ORDER — LABETALOL HCL 5 MG/ML IV SOLN: 10.0000 mg | INTRAVENOUS | Status: DC | PRN

## 2019-04-21 MED ORDER — FENTANYL CITRATE (PF) 100 MCG/2ML IJ SOLN
INTRAMUSCULAR | Status: DC | PRN
  Administered 2019-04-21: 50 ug via INTRAVENOUS

## 2019-04-21 MED ORDER — FENTANYL CITRATE (PF) 100 MCG/2ML IJ SOLN
INTRAMUSCULAR | Status: AC
  Filled 2019-04-21: qty 2

## 2019-04-21 MED ORDER — SODIUM CHLORIDE 0.9 % IV SOLN: 250.0000 mL | INTRAVENOUS | Status: DC | PRN

## 2019-04-21 MED ORDER — PROTAMINE SULFATE 10 MG/ML IV SOLN
INTRAVENOUS | Status: AC
  Filled 2019-04-21: qty 5

## 2019-04-21 MED ORDER — PROTAMINE SULFATE 10 MG/ML IV SOLN
25.0000 mg | Freq: Once | INTRAVENOUS | Status: AC
  Administered 2019-04-21: 25 mg via INTRAVENOUS

## 2019-04-21 MED ORDER — ONDANSETRON HCL 4 MG/2ML IJ SOLN: 4.0000 mg | Freq: Four times a day (QID) | INTRAMUSCULAR | Status: DC | PRN

## 2019-04-21 MED ORDER — MIDAZOLAM HCL 2 MG/2ML IJ SOLN
INTRAMUSCULAR | Status: DC | PRN
  Administered 2019-04-21: 1 mg via INTRAVENOUS

## 2019-04-21 MED ORDER — LIDOCAINE HCL (PF) 1 % IJ SOLN
INTRAMUSCULAR | Status: AC
  Filled 2019-04-21: qty 30

## 2019-04-21 MED ORDER — MIDAZOLAM HCL 2 MG/2ML IJ SOLN
INTRAMUSCULAR | Status: AC
  Filled 2019-04-21: qty 2

## 2019-04-21 MED ORDER — OXYCODONE HCL 5 MG PO TABS: 5.0000 mg | ORAL_TABLET | ORAL | Status: DC | PRN

## 2019-04-21 SURGICAL SUPPLY — 18 items
BALLN MUSTANG 4X60X135 (BALLOONS) ×3
BALLOON MUSTANG 4X60X135 (BALLOONS) ×2 IMPLANT
CATH OMNI FLUSH 5F 65CM (CATHETERS) ×3 IMPLANT
CLOSURE MYNX CONTROL 6F/7F (Vascular Products) ×3 IMPLANT
GLIDEWIRE ADV .035X260CM (WIRE) ×3 IMPLANT
KIT ENCORE 26 ADVANTAGE (KITS) ×3 IMPLANT
KIT MICROPUNCTURE NIT STIFF (SHEATH) ×3 IMPLANT
KIT PV (KITS) ×3 IMPLANT
SHEATH FLEX ANSEL ANG 6F 45CM (SHEATH) ×3 IMPLANT
SHEATH PINNACLE 5F 10CM (SHEATH) ×3 IMPLANT
SHEATH PINNACLE 6F 10CM (SHEATH) ×3 IMPLANT
SHEATH PROBE COVER 6X72 (BAG) ×3 IMPLANT
SHIELD RADPAD SCOOP 12X17 (MISCELLANEOUS) ×3 IMPLANT
STENT TIGRIS 5X60X120 (Permanent Stent) ×6 IMPLANT
SYR MEDRAD MARK V 150ML (SYRINGE) ×3 IMPLANT
TRANSDUCER W/STOPCOCK (MISCELLANEOUS) ×3 IMPLANT
TRAY PV CATH (CUSTOM PROCEDURE TRAY) ×3 IMPLANT
WIRE BENTSON .035X145CM (WIRE) ×3 IMPLANT

## 2019-04-21 NOTE — H&P (Signed)
   History and Physical Update  The patient was interviewed and re-examined.  The patient's previous History and Physical has been reviewed and is unchanged from recent office visit. Plan for aortogram with possible intervention on the right.   Blake Vetrano C. Donzetta Matters, MD [facility] [place] Office: [phone] Pager: [phone]  04/21/2019, 10:04 AM

## 2019-04-21 NOTE — Progress Notes (Signed)
D/c instructions given to husband Elta Guadeloupe via telephone due to visitor restrictions

## 2019-04-21 NOTE — Progress Notes (Addendum)
Received pt due top fail Mynx Closure.  Manual pressure held for 40 mins. .  Noted brusing pre and post site level 1. Clean gauze appiled and tegaderm.   PT is dopplered.  Protamin given per MD order.

## 2019-04-21 NOTE — Progress Notes (Signed)
Ambulated in hallway and to bathroom tol well.

## 2019-04-21 NOTE — Discharge Instructions (Signed)
Femoral Site Care °This sheet gives you information about how to care for yourself after your procedure. Your health care provider may also give you more specific instructions. If you have problems or questions, contact your health care provider. °What can I expect after the procedure? °After the procedure, it is common to have: °· Bruising that usually fades within 1-2 weeks. °· Tenderness at the site. °Follow these instructions at home: °Wound care °· Follow instructions from your health care provider about how to take care of your insertion site. Make sure you: °? Wash your hands with soap and water before you change your bandage (dressing). If soap and water are not available, use hand sanitizer. °? Change your dressing as told by your health care provider. °? Leave stitches (sutures), skin glue, or adhesive strips in place. These skin closures may need to stay in place for 2 weeks or longer. If adhesive strip edges start to loosen and curl up, you may trim the loose edges. Do not remove adhesive strips completely unless your health care provider tells you to do that. °· Do not take baths, swim, or use a hot tub until your health care provider approves. °· You may shower 24-48 hours after the procedure or as told by your health care provider. °? Gently wash the site with plain soap and water. °? Pat the area dry with a clean towel. °? Do not rub the site. This may cause bleeding. °· Do not apply powder or lotion to the site. Keep the site clean and dry. °· Check your femoral site every day for signs of infection. Check for: °? Redness, swelling, or pain. °? Fluid or blood. °? Warmth. °? Pus or a bad smell. °Activity °· For the first 2-3 days after your procedure, or as long as directed: °? Avoid climbing stairs as much as possible. °? Do not squat. °· Do not lift anything that is heavier than 10 lb (4.5 kg), or the limit that you are told, until your health care provider says that it is safe. °· Rest as  directed. °? Avoid sitting for a long time without moving. Get up to take short walks every 1-2 hours. °· Do not drive for 24 hours if you were given a medicine to help you relax (sedative). °General instructions °· Take over-the-counter and prescription medicines only as told by your health care provider. °· Keep all follow-up visits as told by your health care provider. This is important. °Contact a health care provider if you have: °· A fever or chills. °· You have redness, swelling, or pain around your insertion site. °Get help right away if: °· The catheter insertion area swells very fast. °· You pass out. °· You suddenly start to sweat or your skin gets clammy. °· The catheter insertion area is bleeding, and the bleeding does not stop when you hold steady pressure on the area. °· The area near or just beyond the catheter insertion site becomes pale, cool, tingly, or numb. °These symptoms may represent a serious problem that is an emergency. Do not wait to see if the symptoms will go away. Get medical help right away. Call your local emergency services (911 in the [place] Do not drive yourself to the hospital. °Summary °· After the procedure, it is common to have bruising that usually fades within 1-2 weeks. °· Check your femoral site every day for signs of infection. °· Do not lift anything that is heavier than 10 lb (4.5 kg), or the   limit that you are told, until your health care provider says that it is safe. °This information is not intended to replace advice given to you by your health care provider. Make sure you discuss any questions you have with your health care provider. °Document Released: 03/27/2014 Document Revised: 08/06/2017 Document Reviewed: 08/06/2017 °Elsevier Patient Education © 2020 Elsevier Inc. ° °

## 2019-04-21 NOTE — Op Note (Signed)
    Patient name: Kylie Flowers MRN: [id number] DOB: 10/14/1945 Sex: female  04/21/2019 Pre-operative Diagnosis: Critical right lower extremity ischemia with toe ulceration Post-operative diagnosis:  Same Surgeon:  Erlene Quan C. Donzetta Matters, MD Procedure Performed: 1.  Ultrasound-guided cannulation left common femoral artery 2.  Aortogram bilateral lower extremity runoff 3.  Stent of right popliteal artery with 5 x 60 mm Tigris x2 4.  Moderate sedation with fentanyl and Versed for 5.  Mynx was closure left common femoral artery for 50 minutes  Indications: 73 year old female was evaluated for right small toe ulceration.  She was found to have depressed ABI was indicated for aortogram bilateral runoff possible invention on the right.  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  This was soft concerning for in situ thrombosis or embolic disease.  This was primarily stented initially with Tigris stent above the knee there was dissection distally it was then stented through the knee joint with another Tigris distally.  After stenting there is no further stenosis where previously was occluded there is no dissection.  She does have runoff via 3 vessels the dominant being the posterior tibial on the right.  On the left side she has no evidence of disease has three-vessel runoff with anterior tibial and posterior tibial running onto the left foot   Procedure:  The patient was identified in the holding area and taken to room 8.  The patient was then placed supine on the table and prepped and draped in the usual sterile fashion.  A time out was called.  Ultrasound was used to evaluate the left common femoral artery which was noted be patent and compressible.  There is anesthetized 1% lidocaine cannulated with direct ultrasound visualization with micropuncture needle followed by wire sheath.  Images saved the permanent record.  A Bentson wire was  placed followed by 5 [place] sheath.  Omni catheter placed the level of L1 aortogram performed followed by bilateral extremity runoff.  With the above findings we crossed the bifurcation with Omni catheter Glidewire advantage.  We placed a long 6 French sheath into the right SFA patient was fully heparinized.  We crossed the lesion with a Glidewire advantage demonstrated intraluminal access.  This was first balloon dilated with 4 mm balloon we then primarily stented with 5 x 60 mm Tigris.  We then postdilated with a 4 mm balloon again.  Completion demonstrated dissection distally.  We extended across the knee joint with another 5 x 60 Tigers.  This time we only tapped that stent distally with a 4 mm balloon inflated to 4 atm.  In the stent overlap we did inflate to 8 atm.  Completion demonstrated the above findings with no residual dissection no stenosis where previously was occluded.  Satisfied we exchanged for short 6 French sheath blood neck device in the left common femoral artery.  She tolerated procedure without any complication.  Contrast: 150 cc   Rahn Lacuesta C. Donzetta Matters, MD [facility] [place] Office: [phone] Pager: [phone]

## 2019-04-22 ENCOUNTER — Encounter (HOSPITAL_COMMUNITY): Payer: Self-pay | Admitting: Vascular Surgery

## 2019-04-25 DIAGNOSIS — Z23 Encounter for immunization: Secondary | ICD-10-CM | POA: Diagnosis not present

## 2019-05-05 DIAGNOSIS — L97511 Non-pressure chronic ulcer of other part of right foot limited to breakdown of skin: Secondary | ICD-10-CM | POA: Diagnosis not present

## 2019-05-12 DIAGNOSIS — Z79899 Other long term (current) drug therapy: Secondary | ICD-10-CM | POA: Diagnosis not present

## 2019-05-12 DIAGNOSIS — M810 Age-related osteoporosis without current pathological fracture: Secondary | ICD-10-CM | POA: Diagnosis not present

## 2019-05-12 DIAGNOSIS — M059 Rheumatoid arthritis with rheumatoid factor, unspecified: Secondary | ICD-10-CM | POA: Diagnosis not present

## 2019-05-28 ENCOUNTER — Telehealth (HOSPITAL_COMMUNITY): Payer: Self-pay | Admitting: *Deleted

## 2019-05-28 ENCOUNTER — Other Ambulatory Visit: Payer: Self-pay

## 2019-05-28 DIAGNOSIS — I739 Peripheral vascular disease, unspecified: Secondary | ICD-10-CM

## 2019-05-28 NOTE — Telephone Encounter (Signed)

## 2019-05-30 ENCOUNTER — Ambulatory Visit (INDEPENDENT_AMBULATORY_CARE_PROVIDER_SITE_OTHER)
Admit: 2019-05-30 | Discharge: 2019-05-30 | Disposition: A | Discharge status: Home / Self Care | Payer: PPO | Attending: Family | Admitting: Family

## 2019-05-30 ENCOUNTER — Other Ambulatory Visit: Payer: Self-pay

## 2019-05-30 ENCOUNTER — Ambulatory Visit (INDEPENDENT_AMBULATORY_CARE_PROVIDER_SITE_OTHER): Payer: PPO | Admitting: Physician Assistant

## 2019-05-30 ENCOUNTER — Ambulatory Visit (HOSPITAL_COMMUNITY)
Admission: RE | Admit: 2019-05-30 | Discharge: 2019-05-30 | Disposition: A | Discharge status: Home / Self Care | Payer: PPO | Source: Ambulatory Visit | Attending: Family | Admitting: Family

## 2019-05-30 VITALS — BP 154/73 | HR 57 | Temp 97.3°F | Resp 18 | Ht 62.0 in | Wt 129.0 lb

## 2019-05-30 DIAGNOSIS — I739 Peripheral vascular disease, unspecified: Secondary | ICD-10-CM | POA: Insufficient documentation

## 2019-05-30 DIAGNOSIS — L97511 Non-pressure chronic ulcer of other part of right foot limited to breakdown of skin: Secondary | ICD-10-CM

## 2019-05-30 NOTE — Progress Notes (Signed)
    Postoperative Visit (Angio)   History of Present Illness   Kylie Flowers is a 73 y.o. female who presents to clinic to go over vascular studies.  She is status post right popliteal artery stenting with Tigris by Dr. Donzetta Matters on 04/21/2019.  Patient states that she denies any claudication, rest pain, or active tissue ischemia of bilateral lower extremities.  She states ulceration of right fifth toe is now healed having had popliteal artery stent.  She is taking aspirin, Plavix, and statin daily.  Patient wonders if she can proceed with bunionectomy with Dr. Doran Durand.  Past Medical History, Past Surgical History, Social History, Family History, Medications, Allergies, and Review of Systems are unchanged from previous evaluation.  Current Outpatient Medications  Medication Sig Dispense Refill  . aspirin EC 81 MG tablet Take 81 mg by mouth every evening.    . clopidogrel (PLAVIX) 75 MG tablet Take 1 tablet (75 mg total) by mouth daily. (Patient taking differently: Take 75 mg by mouth every evening. ) 30 tablet 11  . folic acid (FOLVITE) 1 MG tablet Take 1 mg by mouth every evening.     . methotrexate (RHEUMATREX) 2.5 MG tablet Take 10 mg by mouth every Tuesday. Caution:Chemotherapy. Protect from light.    . predniSONE (DELTASONE) 1 MG tablet Take 3 mg by mouth every evening.   0  . rosuvastatin (CRESTOR) 10 MG tablet Take 1 tablet (10 mg total) by mouth at bedtime. 30 tablet 11   No current facility-administered medications for this visit.     ROS: 10 system ROS is negative unless otherwise noted in HPI   For VQI Use Only   PRE-ADM LIVING: Home  AMB STATUS: Ambulatory   Physical Examination   Vitals:   05/30/19 1228  BP: (!) 154/73  Pulse: (!) 57  Resp: 18  Temp: (!) 97.3 F (36.3 C)  SpO2: 100%  Weight: 129 lb (58.5 kg)  Height: 5\' 2"  (1.575 m)   Body mass index is 23.59 kg/m.  General Alert, O x 3, WD, NAD  Pulmonary Sym exp, good B air movt  Cardiac RRR, Nl S1, S2    Vascular Vessel Right Left  PT Faintly palpable Faintly palpable  DP Not palpable Not palpable    Gastrointestinal soft, non-distended, non-tender to palpation,   Musculoskeletal M/S 5/5 throughout  , Extremities without ischemic changes  , No edema present, right fifth toe ulceration now healed  Neurologic  Pain and light touch intact in extremities , Motor exam as listed above    Medical Decision Making   Kylie Flowers is a 73 y.o. female who presents s/p right popliteal artery Tigris stent graft on 04/21/2019  . Arterial duplex demonstrates a patent popliteal artery stent graft without in-stent stenosis  Right ABI improved post procedure  Continue dual antiplatelet therapy  Patient now with adequate blood flow to heal right foot surgery with acceptable risk from vascular standpoint  Recheck arterial duplex and ABI in 6 months per protocol   Dagoberto Ligas PA-C [facility] [place] Office: [phone]  Clinic MD: Dr. Carlis Abbott

## 2019-06-16 DIAGNOSIS — E875 Hyperkalemia: Secondary | ICD-10-CM | POA: Diagnosis not present

## 2019-07-17 DIAGNOSIS — E875 Hyperkalemia: Secondary | ICD-10-CM | POA: Diagnosis not present

## 2019-07-17 DIAGNOSIS — I70209 Unspecified atherosclerosis of native arteries of extremities, unspecified extremity: Secondary | ICD-10-CM | POA: Diagnosis not present

## 2019-07-17 DIAGNOSIS — E785 Hyperlipidemia, unspecified: Secondary | ICD-10-CM | POA: Diagnosis not present

## 2019-07-21 DIAGNOSIS — E785 Hyperlipidemia, unspecified: Secondary | ICD-10-CM | POA: Diagnosis not present

## 2019-08-12 DIAGNOSIS — Z79899 Other long term (current) drug therapy: Secondary | ICD-10-CM | POA: Diagnosis not present

## 2019-08-12 DIAGNOSIS — M059 Rheumatoid arthritis with rheumatoid factor, unspecified: Secondary | ICD-10-CM | POA: Diagnosis not present

## 2019-08-13 DIAGNOSIS — H6123 Impacted cerumen, bilateral: Secondary | ICD-10-CM | POA: Diagnosis not present

## 2019-08-13 DIAGNOSIS — H9221 Otorrhagia, right ear: Secondary | ICD-10-CM | POA: Diagnosis not present

## 2019-08-15 DIAGNOSIS — H6121 Impacted cerumen, right ear: Secondary | ICD-10-CM | POA: Diagnosis not present

## 2019-08-15 DIAGNOSIS — H903 Sensorineural hearing loss, bilateral: Secondary | ICD-10-CM | POA: Diagnosis not present

## 2019-09-15 ENCOUNTER — Ambulatory Visit: Payer: PPO

## 2019-10-01 DIAGNOSIS — M79641 Pain in right hand: Secondary | ICD-10-CM | POA: Diagnosis not present

## 2019-10-01 DIAGNOSIS — M66241 Spontaneous rupture of extensor tendons, right hand: Secondary | ICD-10-CM | POA: Diagnosis not present

## 2019-10-09 DIAGNOSIS — M66241 Spontaneous rupture of extensor tendons, right hand: Secondary | ICD-10-CM | POA: Diagnosis not present

## 2019-10-15 DIAGNOSIS — M66241 Spontaneous rupture of extensor tendons, right hand: Secondary | ICD-10-CM | POA: Diagnosis not present

## 2019-11-10 DIAGNOSIS — Z79899 Other long term (current) drug therapy: Secondary | ICD-10-CM | POA: Diagnosis not present

## 2019-11-10 DIAGNOSIS — J449 Chronic obstructive pulmonary disease, unspecified: Secondary | ICD-10-CM | POA: Diagnosis not present

## 2019-11-10 DIAGNOSIS — M059 Rheumatoid arthritis with rheumatoid factor, unspecified: Secondary | ICD-10-CM | POA: Diagnosis not present

## 2019-11-10 DIAGNOSIS — M810 Age-related osteoporosis without current pathological fracture: Secondary | ICD-10-CM | POA: Diagnosis not present

## 2019-11-10 DIAGNOSIS — E785 Hyperlipidemia, unspecified: Secondary | ICD-10-CM | POA: Diagnosis not present

## 2019-11-20 DIAGNOSIS — M19031 Primary osteoarthritis, right wrist: Secondary | ICD-10-CM | POA: Diagnosis not present

## 2019-11-20 DIAGNOSIS — M66241 Spontaneous rupture of extensor tendons, right hand: Secondary | ICD-10-CM | POA: Diagnosis not present

## 2019-11-20 DIAGNOSIS — M659 Synovitis and tenosynovitis, unspecified: Secondary | ICD-10-CM | POA: Diagnosis not present

## 2019-11-20 DIAGNOSIS — S66314A Strain of extensor muscle, fascia and tendon of right ring finger at wrist and hand level, initial encounter: Secondary | ICD-10-CM | POA: Diagnosis not present

## 2019-11-20 DIAGNOSIS — S66316A Strain of extensor muscle, fascia and tendon of right little finger at wrist and hand level, initial encounter: Secondary | ICD-10-CM | POA: Diagnosis not present

## 2019-11-26 ENCOUNTER — Other Ambulatory Visit: Payer: Self-pay | Admitting: *Deleted

## 2019-11-26 DIAGNOSIS — I739 Peripheral vascular disease, unspecified: Secondary | ICD-10-CM

## 2019-11-28 ENCOUNTER — Telehealth (HOSPITAL_COMMUNITY): Payer: Self-pay

## 2019-11-28 NOTE — Telephone Encounter (Signed)

## 2019-12-01 ENCOUNTER — Ambulatory Visit (INDEPENDENT_AMBULATORY_CARE_PROVIDER_SITE_OTHER)
Admission: RE | Admit: 2019-12-01 | Discharge: 2019-12-01 | Disposition: A | Discharge status: Home / Self Care | Payer: PPO | Source: Ambulatory Visit | Attending: Surgery | Admitting: Surgery

## 2019-12-01 ENCOUNTER — Other Ambulatory Visit: Payer: Self-pay

## 2019-12-01 ENCOUNTER — Ambulatory Visit (HOSPITAL_COMMUNITY)
Admission: RE | Admit: 2019-12-01 | Discharge: 2019-12-01 | Disposition: A | Discharge status: Home / Self Care | Payer: PPO | Source: Ambulatory Visit | Attending: Surgery | Admitting: Surgery

## 2019-12-01 ENCOUNTER — Ambulatory Visit (INDEPENDENT_AMBULATORY_CARE_PROVIDER_SITE_OTHER): Payer: PPO | Admitting: Physician Assistant

## 2019-12-01 VITALS — BP 164/70 | HR 66 | Temp 98.1°F | Resp 18 | Ht 62.0 in | Wt 131.3 lb

## 2019-12-01 DIAGNOSIS — I739 Peripheral vascular disease, unspecified: Secondary | ICD-10-CM | POA: Insufficient documentation

## 2019-12-01 NOTE — Progress Notes (Signed)
[facility] HISTORY AND PHYSICAL   History of Present Illness:  Patient is a 74 y.o. year old female who presents for evaluation and follow up of PAD.  She has a history of right foot non healing wound and under went Aortogram with intervention with Stent of right above knee popliteal artery with 5 x 60 mm [place] x2 on 04/21/2019 by Dr. Donzetta Matters.     She states the right foot wound healed well and denise recurrent wounds since her procedure.  She does say that her leg feels weaker and some calf pain that is becoming more frequent.    She has had right UE surgery 2 weeks ago and is following up this week.   She continues to take Asprin , Plavix and a Statin daily.     Past Medical History:  Diagnosis Date  . Anemia    in her teens  . Arthritis    Rheumatoid Arthritis  . Family history of adverse reaction to anesthesia    mom hallucinates after surgery  . Heart murmur    has had it "for years"  . Hypertension     Past Surgical History:  Procedure Laterality Date  . ABDOMINAL AORTOGRAM W/LOWER EXTREMITY Bilateral 04/21/2019   Procedure: ABDOMINAL AORTOGRAM W/LOWER EXTREMITY;  Surgeon: Waynetta Sandy, MD;  Location: [facility];  Service: Cardiovascular;  Laterality: Bilateral;  . BUNIONECTOMY    . COLONOSCOPY    . DILATION AND CURETTAGE, DIAGNOSTIC / THERAPEUTIC    . EYE SURGERY Bilateral    cataract surgery with lens implant  . INGUINAL HERNIA REPAIR Left 07/21/2016   Procedure: LAPAROSCOPIC INDIRECT INGUINAL HERNIA;  Surgeon: Clovis Riley, MD;  Location: [facility];  Service: General;  Laterality: Left;  . INSERTION OF MESH N/A 07/21/2016   Procedure: INSERTION OF MESH;  Surgeon: Clovis Riley, MD;  Location: [facility];  Service: General;  Laterality: N/A;  . PERIPHERAL VASCULAR INTERVENTION Right 04/21/2019   Procedure: PERIPHERAL VASCULAR INTERVENTION;  Surgeon: Waynetta Sandy, MD;  Location: [facility];  Service:  Cardiovascular;  Laterality: Right;  popliteal stent  . TUBAL LIGATION      ROS:   General:  No weight loss, Fever, chills  HEENT: No recent headaches, no nasal bleeding, no visual changes, no sore throat  Neurologic: No dizziness, blackouts, seizures. No recent symptoms of stroke or mini- stroke. No recent episodes of slurred speech, or temporary blindness.  Cardiac: No recent episodes of chest pain/pressure, no shortness of breath at rest.  No shortness of breath with exertion.  Denies history of atrial fibrillation or irregular heartbeat  Vascular: No history of rest pain in feet.  No history of claudication.  No history of non-healing ulcer, No history of DVT   Pulmonary: No home oxygen, no productive cough, no hemoptysis,  No asthma or wheezing  Musculoskeletal:  [ ]  Arthritis, [ ]  Low back pain,  [ ]  Joint pain  Hematologic:No history of hypercoagulable state.  No history of easy bleeding.  No history of anemia  Gastrointestinal: No hematochezia or melena,  No gastroesophageal reflux, no trouble swallowing  Urinary: [ ]  chronic Kidney disease, [ ]  on HD - [ ]  MWF or [ ]  TTHS, [ ]  Burning with urination, [ ]  Frequent urination, [ ]  Difficulty urinating;   Skin: No rashes  Psychological: No history of anxiety,  No history of depression  Social History Social History   Tobacco Use  . Smoking status:  Former Smoker  . Smokeless tobacco: Never Used  . Tobacco comment: maybe smokes 5 cigarettes a day  Substance Use Topics  . Alcohol use: Yes    Comment: occasional  . Drug use: No    Family History Family History  Problem Relation Age of Onset  . Hypertension Mother   . Fibromyalgia Mother   . Lung cancer Mother   . Hypertension Father   . Lung cancer Father     Allergies  Allergies  Allergen Reactions  . Penicillins Hives    Did it involve swelling of the face/tongue/throat, SOB, or low BP? Unknown Did it involve sudden or severe rash/hives, skin peeling, or  any reaction on the inside of your mouth or nose? Unknown Did you need to seek medical attention at a hospital or doctor's office? No When did it last happen?childhood allergy If all above answers are "NO", may proceed with cephalosporin use.      Current Outpatient Medications  Medication Sig Dispense Refill  . aspirin EC 81 MG tablet Take 81 mg by mouth every evening.    . clopidogrel (PLAVIX) 75 MG tablet Take 1 tablet (75 mg total) by mouth daily. (Patient taking differently: Take 75 mg by mouth every evening. ) 30 tablet 11  . folic acid (FOLVITE) 1 MG tablet Take 1 mg by mouth every evening.     . methotrexate (RHEUMATREX) 2.5 MG tablet Take 10 mg by mouth every Tuesday. Caution:Chemotherapy. Protect from light.    . predniSONE (DELTASONE) 1 MG tablet Take 3 mg by mouth every evening.   0  . rosuvastatin (CRESTOR) 10 MG tablet Take 1 tablet (10 mg total) by mouth at bedtime. 30 tablet 11   No current facility-administered medications for this visit.    Physical Examination  Vitals:   12/01/19 1057  BP: (!) 164/70  Pulse: 66  Resp: 18  Temp: 98.1 F (36.7 C)  TempSrc: Temporal  SpO2: 100%  Weight: 131 lb 4.8 oz (59.6 kg)  Height: 5\' 2"  (1.575 m)    Body mass index is 24.02 kg/m.  General:  Alert and oriented, no acute distress HEENT: Normal Neck: No bruit or JVD Pulmonary: Clear to auscultation bilaterally Cardiac: Regular Rate and Rhythm without murmur Abdomen: Soft, non-tender, non-distended, no mass, no scars Skin: No rash Extremity Pulses:  2+ radial, brachial, femoral, dorsalis pedis, posterior tibial pulses bilaterally Musculoskeletal: No deformity or edema  Neurologic: Upper and lower extremity motor 5/5 and symmetric  DATA:    +-----------+--------+-----+---------------+----------+-----------------+  RIGHT   PSV cm/sRatioStenosis    Waveform Comments        +-----------+--------+-----+---------------+----------+-----------------+  CFA Distal 136              triphasic            +-----------+--------+-----+---------------+----------+-----------------+  DFA    155      30-49% stenosistriphasic            +-----------+--------+-----+---------------+----------+-----------------+  SFA Prox  190      30-49% stenosistriphasic            +-----------+--------+-----+---------------+----------+-----------------+  SFA Mid  79              triphasic            +-----------+--------+-----+---------------+----------+-----------------+  SFA Distal 168              triphasic Collaterals noted  +-----------+--------+-----+---------------+----------+-----------------+  POP Prox  Stent        +-----------+--------+-----+---------------+----------+-----------------+  POP Mid                      Stent        +-----------+--------+-----+---------------+----------+-----------------+  POP Distal                     Stent        +-----------+--------+-----+---------------+----------+-----------------+  ATA Distal 27              monophasic           +-----------+--------+-----+---------------+----------+-----------------+  PTA Prox  44              monophasic           +-----------+--------+-----+---------------+----------+-----------------+  PTA Mid  40              monophasic           +-----------+--------+-----+---------------+----------+-----------------+  PTA Distal 39              monophasic           +-----------+--------+-----+---------------+----------+-----------------+  PERO Prox 13               monophasic           +-----------+--------+-----+---------------+----------+-----------------+  PERO Mid  22              monophasic           +-----------+--------+-----+---------------+----------+-----------------+  PERO Distal51              monophasic           +-----------+--------+-----+---------------+----------+-----------------+     Right Stent(s):  +----------------+--------+---------------+----------+--------+  Popliteal ArteryPSV cm/sStenosis    Waveform Comments  +----------------+--------+---------------+----------+--------+  Prox to Stent  40           monophasic      +----------------+--------+---------------+----------+--------+  Proximal Stent 71           monophasic      +----------------+--------+---------------+----------+--------+  Mid Stent    495   50-99% stenosismonophasicWith PST  +----------------+--------+---------------+----------+--------+  Distal Stent  479   50-99% stenosismonophasicWith PST  +----------------+--------+---------------+----------+--------+  Distal to Stent 68           monophasic      +----------------+--------+---------------+----------+--------+     Summary:  Right:  30-49% stenosis noted in the deep femoral artery.  30-49% stenosis noted in the superficial femoral artery.   Patent right popliteal artery stent. Elevated velocities of 495 cm/s and  stent narrowing indicating a stenosis of 50-99% noted within the mid and  distal stent.   ASSESSMENT:  History of PAD with non healing right foot wound healed after right AK popliteal stenting.  Today's duplex and ABI shows stenosis in the stent with increased velocities > 400 cm/s   PLAN:  She will be scheduled for repeat Aortogram and possible intervention Early next week after her Orthopedic f/u visit.   Roxy Horseman PA-C [facility] [place] [facility] Office: [phone]

## 2019-12-02 ENCOUNTER — Other Ambulatory Visit: Payer: Self-pay

## 2019-12-04 ENCOUNTER — Inpatient Hospital Stay (HOSPITAL_COMMUNITY): Admission: RE | Admit: 2019-12-04 | Payer: PPO | Source: Ambulatory Visit

## 2019-12-05 ENCOUNTER — Other Ambulatory Visit (HOSPITAL_COMMUNITY)
Admission: RE | Admit: 2019-12-05 | Discharge: 2019-12-05 | Disposition: A | Discharge status: Home / Self Care | Payer: PPO | Source: Ambulatory Visit | Attending: Vascular Surgery | Admitting: Vascular Surgery

## 2019-12-05 DIAGNOSIS — Z20822 Contact with and (suspected) exposure to covid-19: Secondary | ICD-10-CM | POA: Insufficient documentation

## 2019-12-05 DIAGNOSIS — Z01812 Encounter for preprocedural laboratory examination: Secondary | ICD-10-CM | POA: Diagnosis not present

## 2019-12-05 DIAGNOSIS — M66241 Spontaneous rupture of extensor tendons, right hand: Secondary | ICD-10-CM | POA: Diagnosis not present

## 2019-12-05 DIAGNOSIS — M25531 Pain in right wrist: Secondary | ICD-10-CM | POA: Diagnosis not present

## 2019-12-05 DIAGNOSIS — Z4789 Encounter for other orthopedic aftercare: Secondary | ICD-10-CM | POA: Diagnosis not present

## 2019-12-05 DIAGNOSIS — M79601 Pain in right arm: Secondary | ICD-10-CM | POA: Diagnosis not present

## 2019-12-06 LAB — SARS CORONAVIRUS 2 (TAT 6-24 HRS): SARS Coronavirus 2: NEGATIVE

## 2019-12-08 ENCOUNTER — Ambulatory Visit (HOSPITAL_COMMUNITY)
Admission: RE | Admit: 2019-12-08 | Discharge: 2019-12-08 | Disposition: A | Discharge status: Home / Self Care | Payer: PPO | Attending: Vascular Surgery | Admitting: Vascular Surgery

## 2019-12-08 ENCOUNTER — Other Ambulatory Visit: Payer: Self-pay

## 2019-12-08 ENCOUNTER — Encounter (HOSPITAL_COMMUNITY)
Admission: RE | Disposition: A | Discharge status: Home / Self Care | Payer: Self-pay | Source: Home / Self Care | Attending: Vascular Surgery

## 2019-12-08 DIAGNOSIS — T82856A Stenosis of peripheral vascular stent, initial encounter: Secondary | ICD-10-CM | POA: Diagnosis not present

## 2019-12-08 DIAGNOSIS — Z7902 Long term (current) use of antithrombotics/antiplatelets: Secondary | ICD-10-CM | POA: Insufficient documentation

## 2019-12-08 DIAGNOSIS — I70211 Atherosclerosis of native arteries of extremities with intermittent claudication, right leg: Secondary | ICD-10-CM | POA: Insufficient documentation

## 2019-12-08 DIAGNOSIS — F17210 Nicotine dependence, cigarettes, uncomplicated: Secondary | ICD-10-CM | POA: Diagnosis not present

## 2019-12-08 DIAGNOSIS — I10 Essential (primary) hypertension: Secondary | ICD-10-CM | POA: Diagnosis not present

## 2019-12-08 DIAGNOSIS — M069 Rheumatoid arthritis, unspecified: Secondary | ICD-10-CM | POA: Insufficient documentation

## 2019-12-08 DIAGNOSIS — Z7982 Long term (current) use of aspirin: Secondary | ICD-10-CM | POA: Insufficient documentation

## 2019-12-08 DIAGNOSIS — Z79899 Other long term (current) drug therapy: Secondary | ICD-10-CM | POA: Insufficient documentation

## 2019-12-08 HISTORY — PX: ABDOMINAL AORTOGRAM W/LOWER EXTREMITY: CATH118223

## 2019-12-08 HISTORY — PX: PERIPHERAL VASCULAR BALLOON ANGIOPLASTY: CATH118281

## 2019-12-08 HISTORY — PX: PERIPHERAL VASCULAR ATHERECTOMY: CATH118256

## 2019-12-08 LAB — POCT I-STAT, CHEM 8
BUN: 21 mg/dL (ref 8–23)
Calcium, Ion: 1.25 mmol/L (ref 1.15–1.40)
Chloride: 106 mmol/L (ref 98–111)
Creatinine, Ser: 0.6 mg/dL (ref 0.44–1.00)
Glucose, Bld: 87 mg/dL (ref 70–99)
HCT: 40 % (ref 36.0–46.0)
Hemoglobin: 13.6 g/dL (ref 12.0–15.0)
Potassium: 4.6 mmol/L (ref 3.5–5.1)
Sodium: 145 mmol/L (ref 135–145)
TCO2: 31 mmol/L (ref 22–32)

## 2019-12-08 SURGERY — ABDOMINAL AORTOGRAM W/LOWER EXTREMITY
Anesthesia: LOCAL | Laterality: Right

## 2019-12-08 MED ORDER — SODIUM CHLORIDE 0.9% FLUSH: 3.0000 mL | Freq: Two times a day (BID) | INTRAVENOUS | Status: DC

## 2019-12-08 MED ORDER — ACETAMINOPHEN 325 MG PO TABS: 650.0000 mg | ORAL_TABLET | ORAL | Status: DC | PRN

## 2019-12-08 MED ORDER — IODIXANOL 320 MG/ML IV SOLN
INTRAVENOUS | Status: DC | PRN
  Administered 2019-12-08: 85 mL via INTRA_ARTERIAL

## 2019-12-08 MED ORDER — FENTANYL CITRATE (PF) 100 MCG/2ML IJ SOLN
INTRAMUSCULAR | Status: AC
  Filled 2019-12-08: qty 2

## 2019-12-08 MED ORDER — OXYCODONE HCL 5 MG PO TABS: 5.0000 mg | ORAL_TABLET | ORAL | Status: DC | PRN

## 2019-12-08 MED ORDER — SODIUM CHLORIDE 0.9 % IV SOLN: INTRAVENOUS | Status: DC

## 2019-12-08 MED ORDER — MIDAZOLAM HCL 2 MG/2ML IJ SOLN
INTRAMUSCULAR | Status: DC | PRN
  Administered 2019-12-08: 2 mg via INTRAVENOUS

## 2019-12-08 MED ORDER — FENTANYL CITRATE (PF) 100 MCG/2ML IJ SOLN
INTRAMUSCULAR | Status: DC | PRN
  Administered 2019-12-08: 50 ug via INTRAVENOUS

## 2019-12-08 MED ORDER — HEPARIN (PORCINE) IN NACL 1000-0.9 UT/500ML-% IV SOLN
INTRAVENOUS | Status: DC | PRN
  Administered 2019-12-08 (×2): 500 mL

## 2019-12-08 MED ORDER — MIDAZOLAM HCL 2 MG/2ML IJ SOLN
INTRAMUSCULAR | Status: AC
  Filled 2019-12-08: qty 2

## 2019-12-08 MED ORDER — LIDOCAINE HCL (PF) 1 % IJ SOLN
INTRAMUSCULAR | Status: DC | PRN
  Administered 2019-12-08: 15 mL via INTRADERMAL

## 2019-12-08 MED ORDER — LABETALOL HCL 5 MG/ML IV SOLN: 10.0000 mg | INTRAVENOUS | Status: DC | PRN

## 2019-12-08 MED ORDER — HEPARIN (PORCINE) IN NACL 1000-0.9 UT/500ML-% IV SOLN
INTRAVENOUS | Status: AC
  Filled 2019-12-08: qty 1000

## 2019-12-08 MED ORDER — LIDOCAINE HCL (PF) 1 % IJ SOLN
INTRAMUSCULAR | Status: AC
  Filled 2019-12-08: qty 30

## 2019-12-08 MED ORDER — HEPARIN SODIUM (PORCINE) 1000 UNIT/ML IJ SOLN
INTRAMUSCULAR | Status: DC | PRN
  Administered 2019-12-08: 7000 [IU] via INTRAVENOUS

## 2019-12-08 MED ORDER — SODIUM CHLORIDE 0.9% FLUSH: 3.0000 mL | INTRAVENOUS | Status: DC | PRN

## 2019-12-08 MED ORDER — SODIUM CHLORIDE 0.9 % IV SOLN: 250.0000 mL | INTRAVENOUS | Status: DC | PRN

## 2019-12-08 MED ORDER — HYDRALAZINE HCL 20 MG/ML IJ SOLN: 5.0000 mg | INTRAMUSCULAR | Status: DC | PRN

## 2019-12-08 MED ORDER — ONDANSETRON HCL 4 MG/2ML IJ SOLN: 4.0000 mg | Freq: Four times a day (QID) | INTRAMUSCULAR | Status: DC | PRN

## 2019-12-08 MED ORDER — HEPARIN SODIUM (PORCINE) 1000 UNIT/ML IJ SOLN
INTRAMUSCULAR | Status: AC
  Filled 2019-12-08: qty 1

## 2019-12-08 MED ORDER — MORPHINE SULFATE (PF) 2 MG/ML IV SOLN: 2.0000 mg | INTRAVENOUS | Status: DC | PRN

## 2019-12-08 SURGICAL SUPPLY — 22 items
BAG SNAP BAND KOVER 36X36 (MISCELLANEOUS) ×1 IMPLANT
CATH AURYON 5FR ATHEREC 1.5 (CATHETERS) ×1 IMPLANT
CATH CXI SUPP 2.6F 150 ST (CATHETERS) ×1 IMPLANT
CATH OMNI FLUSH 5F 65CM (CATHETERS) ×1 IMPLANT
CATH STRAIGHT 5FR 65CM (CATHETERS) IMPLANT
CLOSURE MYNX CONTROL 6F/7F (Vascular Products) ×1 IMPLANT
COVER DOME SNAP 22 D (MISCELLANEOUS) ×1 IMPLANT
DCB RANGER 5.0X60 135 (BALLOONS) IMPLANT
KIT MICROPUNCTURE NIT STIFF (SHEATH) ×1 IMPLANT
KIT PV (KITS) ×3 IMPLANT
RANGER DCB 5.0X60 135 (BALLOONS) ×3
SHEATH PINNACLE 5F 10CM (SHEATH) ×1 IMPLANT
SHEATH PINNACLE 6F 10CM (SHEATH) ×1 IMPLANT
SHEATH PINNACLE MP 6F 45CM (SHEATH) ×1 IMPLANT
SHEATH PROBE COVER 6X72 (BAG) ×1 IMPLANT
SYR MEDRAD MARK 7 150ML (SYRINGE) ×3 IMPLANT
TRANSDUCER W/STOPCOCK (MISCELLANEOUS) ×3 IMPLANT
TRAY PV CATH (CUSTOM PROCEDURE TRAY) ×3 IMPLANT
WIRE BENTSON .035X145CM (WIRE) ×1 IMPLANT
WIRE G V18X300CM (WIRE) ×1 IMPLANT
WIRE SPARTACORE .014X190CM (WIRE) ×1 IMPLANT
WIRE SPARTACORE .014X300CM (WIRE) ×1 IMPLANT

## 2019-12-08 NOTE — H&P (Signed)
   History and Physical Update  The patient was interviewed and re-examined.  The patient's previous History and Physical has been reviewed and is unchanged from recent office visit. Plan for aortogram with possible intervention on the right.   Jadd Gasior C. Donzetta Matters, MD [facility] [place] Office: [phone] Pager: [phone]   12/08/2019, 11:02 AM

## 2019-12-08 NOTE — Discharge Instructions (Signed)
Femoral Site Care This sheet gives you information about how to care for yourself after your procedure. Your health care provider may also give you more specific instructions. If you have problems or questions, contact your health care provider. What can I expect after the procedure? After the procedure, it is common to have:  Bruising that usually fades within 1-2 weeks.  Tenderness at the site. Follow these instructions at home: Wound care  Follow instructions from your health care provider about how to take care of your insertion site. Make sure you: ? Wash your hands with soap and water before you change your bandage (dressing). If soap and water are not available, use hand sanitizer. ? Change your dressing as told by your health care provider. ? Leave stitches (sutures), skin glue, or adhesive strips in place. These skin closures may need to stay in place for 2 weeks or longer. If adhesive strip edges start to loosen and curl up, you may trim the loose edges. Do not remove adhesive strips completely unless your health care provider tells you to do that.  Do not take baths, swim, or use a hot tub until your health care provider approves.  You may shower 24-48 hours after the procedure or as told by your health care provider. ? Gently wash the site with plain soap and water. ? Pat the area dry with a clean towel. ? Do not rub the site. This may cause bleeding.  Do not apply powder or lotion to the site. Keep the site clean and dry.  Check your femoral site every day for signs of infection. Check for: ? Redness, swelling, or pain. ? Fluid or blood. ? Warmth. ? Pus or a bad smell. Activity  For the first 2-3 days after your procedure, or as long as directed: ? Avoid climbing stairs as much as possible. ? Do not squat.  Do not lift anything that is heavier than 10 lb (4.5 kg), or the limit that you are told, until your health care provider says that it is safe.  Rest as  directed. ? Avoid sitting for a long time without moving. Get up to take short walks every 1-2 hours.  Do not drive for 24 hours if you were given a medicine to help you relax (sedative). General instructions  Take over-the-counter and prescription medicines only as told by your health care provider.  Keep all follow-up visits as told by your health care provider. This is important. Contact a health care provider if you have:  A fever or chills.  You have redness, swelling, or pain around your insertion site. Get help right away if:  The catheter insertion area swells very fast.  You pass out.  You suddenly start to sweat or your skin gets clammy.  The catheter insertion area is bleeding, and the bleeding does not stop when you hold steady pressure on the area.  The area near or just beyond the catheter insertion site becomes pale, cool, tingly, or numb. These symptoms may represent a serious problem that is an emergency. Do not wait to see if the symptoms will go away. Get medical help right away. Call your local emergency services (911 in the [place] Do not drive yourself to the hospital. Summary  After the procedure, it is common to have bruising that usually fades within 1-2 weeks.  Check your femoral site every day for signs of infection.  Do not lift anything that is heavier than 10 lb (4.5 kg), or the   limit that you are told, until your health care provider says that it is safe. This information is not intended to replace advice given to you by your health care provider. Make sure you discuss any questions you have with your health care provider. Document Revised: 08/06/2017 Document Reviewed: 08/06/2017 Elsevier Patient Education  2020 Elsevier Inc.  

## 2019-12-08 NOTE — Op Note (Signed)
    Patient name: Kylie Flowers MRN: [id number] DOB: 1945-10-27 Sex: female  12/08/2019 Pre-operative Diagnosis: Right lower extremity in-stent restenosis Post-operative diagnosis:  Same Surgeon:  Erlene Quan C. Donzetta Matters, MD Procedure Performed: 1.  Ultrasound-guided cannulation left common femoral artery 2.  Aortogram with right lower extremity angiography 3.  Laser atherectomy of right popliteal in-stent stenosis 4.  Drug-coated balloon angioplasty right popliteal artery in-stent restenosis 5.  Mynx device closure left common femoral artery 6.  Moderate sedation with fentanyl and Versed for 51 minutes   Indications: 74 year old female with history of right lower extremity ulceration and after stenting she healed the wound.  She now has evidence of high-grade stenosis in her stents.  She is indicated for angiography possible intervention.  Findings: There is approximately 90% stenosis over a 5 cm area in the popliteal artery stent.  After laser atherectomy and drug-coated balloon angioplasty this is reduced to less than 20%.  Dominant runoff.  The posterior tibial also has an anterior tibial and peroneal artery to make it to the ankle.   Procedure:  The patient was identified in the holding area and taken to room 8.  The patient was then placed supine on the table and prepped and draped in the usual sterile fashion.  A time out was called.  Ultrasound was used to evaluate the left common femoral artery this was noted be patent and compressible.  There is anesthetized 1% lidocaine cannulated micropuncture needle followed the wire sheath.  A Bentson wire was placed followed by 5 [place] sheath.  This was done with ultrasound guidance and images saved the permanent record.  An Omni catheter was placed the level of L1 and aortogram was performed.  We crossed the bifurcation of the common femoral artery on the right and perform right lower extremity angiography with the above findings.  We then placed a long  6 French sheath patient was fully heparinized.  We used V 18 and CXI catheter to cross the stenosed area and the stent.  We confirmed intraluminal access.  We exchanged for an 014 wire.  We performed laser atherectomy of the affected area.  This was followed with drug-coated balloon angioplasty at nominal pressure for 3 minutes.  Completion demonstrated resolution of the in-stent stenosis.  Satisfied we exchanged for a Bentson wire.  We placed a short 6 French sheath but a minx device.  She tolerated procedure without immediate complication.  Contrast: 85cc   Analeia Ismael C. Donzetta Matters, MD [facility] Office: [phone] Pager: [phone]

## 2019-12-08 NOTE — Progress Notes (Signed)
Patient and husband was given discharge instructions. Both verbalized understanding. 

## 2019-12-10 DIAGNOSIS — M25641 Stiffness of right hand, not elsewhere classified: Secondary | ICD-10-CM | POA: Diagnosis not present

## 2019-12-17 DIAGNOSIS — M25641 Stiffness of right hand, not elsewhere classified: Secondary | ICD-10-CM | POA: Diagnosis not present

## 2019-12-24 DIAGNOSIS — M25641 Stiffness of right hand, not elsewhere classified: Secondary | ICD-10-CM | POA: Diagnosis not present

## 2020-01-02 DIAGNOSIS — M66241 Spontaneous rupture of extensor tendons, right hand: Secondary | ICD-10-CM | POA: Diagnosis not present

## 2020-01-02 DIAGNOSIS — M25641 Stiffness of right hand, not elsewhere classified: Secondary | ICD-10-CM | POA: Diagnosis not present

## 2020-01-02 DIAGNOSIS — Z4789 Encounter for other orthopedic aftercare: Secondary | ICD-10-CM | POA: Diagnosis not present

## 2020-01-07 DIAGNOSIS — M25641 Stiffness of right hand, not elsewhere classified: Secondary | ICD-10-CM | POA: Diagnosis not present

## 2020-01-15 DIAGNOSIS — M25641 Stiffness of right hand, not elsewhere classified: Secondary | ICD-10-CM | POA: Diagnosis not present

## 2020-01-22 DIAGNOSIS — M25641 Stiffness of right hand, not elsewhere classified: Secondary | ICD-10-CM | POA: Diagnosis not present

## 2020-02-17 DIAGNOSIS — M66241 Spontaneous rupture of extensor tendons, right hand: Secondary | ICD-10-CM | POA: Diagnosis not present

## 2020-02-17 DIAGNOSIS — Z4789 Encounter for other orthopedic aftercare: Secondary | ICD-10-CM | POA: Diagnosis not present

## 2020-02-19 DIAGNOSIS — H6123 Impacted cerumen, bilateral: Secondary | ICD-10-CM | POA: Diagnosis not present

## 2020-03-02 DIAGNOSIS — E785 Hyperlipidemia, unspecified: Secondary | ICD-10-CM | POA: Diagnosis not present

## 2020-03-02 DIAGNOSIS — M810 Age-related osteoporosis without current pathological fracture: Secondary | ICD-10-CM | POA: Diagnosis not present

## 2020-03-02 DIAGNOSIS — J449 Chronic obstructive pulmonary disease, unspecified: Secondary | ICD-10-CM | POA: Diagnosis not present

## 2020-03-02 DIAGNOSIS — M059 Rheumatoid arthritis with rheumatoid factor, unspecified: Secondary | ICD-10-CM | POA: Diagnosis not present

## 2020-03-02 DIAGNOSIS — Z79899 Other long term (current) drug therapy: Secondary | ICD-10-CM | POA: Diagnosis not present

## 2020-04-16 ENCOUNTER — Other Ambulatory Visit: Payer: Self-pay | Admitting: Vascular Surgery

## 2020-04-16 DIAGNOSIS — I739 Peripheral vascular disease, unspecified: Secondary | ICD-10-CM

## 2020-05-03 DIAGNOSIS — Z23 Encounter for immunization: Secondary | ICD-10-CM | POA: Diagnosis not present

## 2020-05-18 ENCOUNTER — Other Ambulatory Visit: Payer: Self-pay | Admitting: Vascular Surgery

## 2020-05-22 ENCOUNTER — Ambulatory Visit: Payer: PPO | Attending: Internal Medicine

## 2020-05-22 DIAGNOSIS — Z23 Encounter for immunization: Secondary | ICD-10-CM

## 2020-05-22 NOTE — Progress Notes (Signed)
   Covid-19 Vaccination Clinic  Name:  Kylie Flowers    MRN: [id number] DOB: [birth year]  05/22/2020  Ms. Kylie Flowers was observed post Covid-19 immunization for 30 minutes based on pre-vaccination screening without incident. She was provided with Vaccine Information Sheet and instruction to access the V-Safe system.   Ms. Kylie Flowers was instructed to call 911 with any severe reactions post vaccine: Marland Kitchen Difficulty breathing  . Swelling of face and throat  . A fast heartbeat  . A bad rash all over body  . Dizziness and weakness

## 2020-07-06 DIAGNOSIS — Z79899 Other long term (current) drug therapy: Secondary | ICD-10-CM | POA: Diagnosis not present

## 2020-07-06 DIAGNOSIS — J449 Chronic obstructive pulmonary disease, unspecified: Secondary | ICD-10-CM | POA: Diagnosis not present

## 2020-07-06 DIAGNOSIS — M810 Age-related osteoporosis without current pathological fracture: Secondary | ICD-10-CM | POA: Diagnosis not present

## 2020-07-06 DIAGNOSIS — E785 Hyperlipidemia, unspecified: Secondary | ICD-10-CM | POA: Diagnosis not present

## 2020-07-06 DIAGNOSIS — M059 Rheumatoid arthritis with rheumatoid factor, unspecified: Secondary | ICD-10-CM | POA: Diagnosis not present

## 2020-09-08 DIAGNOSIS — H6123 Impacted cerumen, bilateral: Secondary | ICD-10-CM | POA: Diagnosis not present

## 2020-10-04 DIAGNOSIS — M059 Rheumatoid arthritis with rheumatoid factor, unspecified: Secondary | ICD-10-CM | POA: Diagnosis not present

## 2020-10-04 DIAGNOSIS — E785 Hyperlipidemia, unspecified: Secondary | ICD-10-CM | POA: Diagnosis not present

## 2020-10-04 DIAGNOSIS — J449 Chronic obstructive pulmonary disease, unspecified: Secondary | ICD-10-CM | POA: Diagnosis not present

## 2020-10-04 DIAGNOSIS — Z79899 Other long term (current) drug therapy: Secondary | ICD-10-CM | POA: Diagnosis not present

## 2020-10-04 DIAGNOSIS — M810 Age-related osteoporosis without current pathological fracture: Secondary | ICD-10-CM | POA: Diagnosis not present

## 2020-12-15 DIAGNOSIS — M059 Rheumatoid arthritis with rheumatoid factor, unspecified: Secondary | ICD-10-CM | POA: Diagnosis not present

## 2020-12-15 DIAGNOSIS — M2032 Hallux varus (acquired), left foot: Secondary | ICD-10-CM | POA: Diagnosis not present

## 2020-12-15 DIAGNOSIS — M79672 Pain in left foot: Secondary | ICD-10-CM | POA: Diagnosis not present

## 2020-12-15 DIAGNOSIS — M21622 Bunionette of left foot: Secondary | ICD-10-CM | POA: Diagnosis not present

## 2020-12-15 DIAGNOSIS — M2042 Other hammer toe(s) (acquired), left foot: Secondary | ICD-10-CM | POA: Diagnosis not present

## 2020-12-28 DIAGNOSIS — M2032 Hallux varus (acquired), left foot: Secondary | ICD-10-CM | POA: Diagnosis not present

## 2020-12-28 DIAGNOSIS — S93525A Sprain of metatarsophalangeal joint of left lesser toe(s), initial encounter: Secondary | ICD-10-CM | POA: Diagnosis not present

## 2020-12-28 DIAGNOSIS — G8918 Other acute postprocedural pain: Secondary | ICD-10-CM | POA: Diagnosis not present

## 2020-12-28 DIAGNOSIS — M21622 Bunionette of left foot: Secondary | ICD-10-CM | POA: Diagnosis not present

## 2020-12-28 DIAGNOSIS — M7742 Metatarsalgia, left foot: Secondary | ICD-10-CM | POA: Diagnosis not present

## 2020-12-28 DIAGNOSIS — T8484XA Pain due to internal orthopedic prosthetic devices, implants and grafts, initial encounter: Secondary | ICD-10-CM | POA: Diagnosis not present

## 2020-12-28 DIAGNOSIS — M25375 Other instability, left foot: Secondary | ICD-10-CM | POA: Diagnosis not present

## 2021-02-03 DIAGNOSIS — E785 Hyperlipidemia, unspecified: Secondary | ICD-10-CM | POA: Diagnosis not present

## 2021-02-03 DIAGNOSIS — I129 Hypertensive chronic kidney disease with stage 1 through stage 4 chronic kidney disease, or unspecified chronic kidney disease: Secondary | ICD-10-CM | POA: Diagnosis not present

## 2021-02-03 DIAGNOSIS — N182 Chronic kidney disease, stage 2 (mild): Secondary | ICD-10-CM | POA: Diagnosis not present

## 2021-02-03 DIAGNOSIS — M810 Age-related osteoporosis without current pathological fracture: Secondary | ICD-10-CM | POA: Diagnosis not present

## 2021-02-09 DIAGNOSIS — M2032 Hallux varus (acquired), left foot: Secondary | ICD-10-CM | POA: Diagnosis not present

## 2021-02-09 DIAGNOSIS — Z4889 Encounter for other specified surgical aftercare: Secondary | ICD-10-CM | POA: Diagnosis not present

## 2021-02-21 ENCOUNTER — Ambulatory Visit: Payer: PPO | Attending: Internal Medicine

## 2021-02-21 DIAGNOSIS — Z23 Encounter for immunization: Secondary | ICD-10-CM

## 2021-02-21 NOTE — Progress Notes (Signed)
   Covid-19 Vaccination Clinic  Name:  ALVENA KIERNAN    MRN: [id number] DOB: 07-03-1946  02/21/2021  Ms. Wurth was observed post Covid-19 immunization for 15 minutes without incident. She was provided with Vaccine Information Sheet and instruction to access the V-Safe system.   Ms. Siegfried was instructed to call 911 with any severe reactions post vaccine: Difficulty breathing  Swelling of face and throat  A fast heartbeat  A bad rash all over body  Dizziness and weakness   Immunizations Administered     Name Date Dose VIS Date Route   PFIZER Comrnaty(Gray TOP) Covid-19 Vaccine 02/21/2021  2:56 PM 0.3 mL 07/15/2020 Intramuscular   Manufacturer: [employer]   Lot: [id number]   [place]: [id number]

## 2021-02-25 ENCOUNTER — Other Ambulatory Visit (HOSPITAL_BASED_OUTPATIENT_CLINIC_OR_DEPARTMENT_OTHER): Payer: Self-pay

## 2021-02-25 MED ORDER — COVID-19 MRNA VAC-TRIS(PFIZER) 30 MCG/0.3ML IM SUSP
INTRAMUSCULAR | 0 refills | Status: AC
  Filled 2021-02-25: qty 0.3, 1d supply, fill #0

## 2021-03-08 DIAGNOSIS — H6123 Impacted cerumen, bilateral: Secondary | ICD-10-CM | POA: Diagnosis not present

## 2021-03-11 DIAGNOSIS — Z4789 Encounter for other orthopedic aftercare: Secondary | ICD-10-CM | POA: Diagnosis not present

## 2021-03-11 DIAGNOSIS — M2032 Hallux varus (acquired), left foot: Secondary | ICD-10-CM | POA: Diagnosis not present

## 2021-03-28 DIAGNOSIS — J45909 Unspecified asthma, uncomplicated: Secondary | ICD-10-CM | POA: Diagnosis not present

## 2021-04-06 DIAGNOSIS — E785 Hyperlipidemia, unspecified: Secondary | ICD-10-CM | POA: Diagnosis not present

## 2021-04-06 DIAGNOSIS — N182 Chronic kidney disease, stage 2 (mild): Secondary | ICD-10-CM | POA: Diagnosis not present

## 2021-04-06 DIAGNOSIS — I129 Hypertensive chronic kidney disease with stage 1 through stage 4 chronic kidney disease, or unspecified chronic kidney disease: Secondary | ICD-10-CM | POA: Diagnosis not present

## 2021-04-06 DIAGNOSIS — M810 Age-related osteoporosis without current pathological fracture: Secondary | ICD-10-CM | POA: Diagnosis not present

## 2021-04-27 ENCOUNTER — Other Ambulatory Visit: Payer: Self-pay | Admitting: Vascular Surgery

## 2021-04-27 DIAGNOSIS — I739 Peripheral vascular disease, unspecified: Secondary | ICD-10-CM

## 2021-05-16 DIAGNOSIS — Z23 Encounter for immunization: Secondary | ICD-10-CM | POA: Diagnosis not present

## 2021-07-06 DIAGNOSIS — I129 Hypertensive chronic kidney disease with stage 1 through stage 4 chronic kidney disease, or unspecified chronic kidney disease: Secondary | ICD-10-CM | POA: Diagnosis not present

## 2021-07-06 DIAGNOSIS — N182 Chronic kidney disease, stage 2 (mild): Secondary | ICD-10-CM | POA: Diagnosis not present

## 2021-07-06 DIAGNOSIS — M810 Age-related osteoporosis without current pathological fracture: Secondary | ICD-10-CM | POA: Diagnosis not present

## 2021-07-06 DIAGNOSIS — E785 Hyperlipidemia, unspecified: Secondary | ICD-10-CM | POA: Diagnosis not present

## 2021-07-11 ENCOUNTER — Other Ambulatory Visit: Payer: Self-pay | Admitting: Vascular Surgery

## 2021-07-19 DIAGNOSIS — J449 Chronic obstructive pulmonary disease, unspecified: Secondary | ICD-10-CM | POA: Diagnosis not present

## 2021-07-19 DIAGNOSIS — M810 Age-related osteoporosis without current pathological fracture: Secondary | ICD-10-CM | POA: Diagnosis not present

## 2021-07-19 DIAGNOSIS — M059 Rheumatoid arthritis with rheumatoid factor, unspecified: Secondary | ICD-10-CM | POA: Diagnosis not present

## 2021-07-19 DIAGNOSIS — E785 Hyperlipidemia, unspecified: Secondary | ICD-10-CM | POA: Diagnosis not present

## 2021-07-19 DIAGNOSIS — Z79899 Other long term (current) drug therapy: Secondary | ICD-10-CM | POA: Diagnosis not present

## 2021-08-05 DIAGNOSIS — M810 Age-related osteoporosis without current pathological fracture: Secondary | ICD-10-CM | POA: Diagnosis not present

## 2021-08-05 DIAGNOSIS — I129 Hypertensive chronic kidney disease with stage 1 through stage 4 chronic kidney disease, or unspecified chronic kidney disease: Secondary | ICD-10-CM | POA: Diagnosis not present

## 2021-08-05 DIAGNOSIS — E785 Hyperlipidemia, unspecified: Secondary | ICD-10-CM | POA: Diagnosis not present

## 2021-08-05 DIAGNOSIS — N182 Chronic kidney disease, stage 2 (mild): Secondary | ICD-10-CM | POA: Diagnosis not present

## 2021-09-14 DIAGNOSIS — H6123 Impacted cerumen, bilateral: Secondary | ICD-10-CM | POA: Diagnosis not present

## 2021-11-24 DIAGNOSIS — Z79899 Other long term (current) drug therapy: Secondary | ICD-10-CM | POA: Diagnosis not present

## 2021-11-24 DIAGNOSIS — J449 Chronic obstructive pulmonary disease, unspecified: Secondary | ICD-10-CM | POA: Diagnosis not present

## 2021-11-24 DIAGNOSIS — M810 Age-related osteoporosis without current pathological fracture: Secondary | ICD-10-CM | POA: Diagnosis not present

## 2021-11-24 DIAGNOSIS — M059 Rheumatoid arthritis with rheumatoid factor, unspecified: Secondary | ICD-10-CM | POA: Diagnosis not present

## 2021-11-24 DIAGNOSIS — E785 Hyperlipidemia, unspecified: Secondary | ICD-10-CM | POA: Diagnosis not present

## 2022-01-05 DIAGNOSIS — M25572 Pain in left ankle and joints of left foot: Secondary | ICD-10-CM | POA: Diagnosis not present

## 2022-01-05 DIAGNOSIS — S9002XA Contusion of left ankle, initial encounter: Secondary | ICD-10-CM | POA: Diagnosis not present

## 2022-01-05 DIAGNOSIS — S8012XA Contusion of left lower leg, initial encounter: Secondary | ICD-10-CM | POA: Diagnosis not present

## 2022-01-26 DIAGNOSIS — S91002A Unspecified open wound, left ankle, initial encounter: Secondary | ICD-10-CM | POA: Diagnosis not present

## 2022-02-06 DIAGNOSIS — L03116 Cellulitis of left lower limb: Secondary | ICD-10-CM | POA: Diagnosis not present

## 2022-02-15 DIAGNOSIS — L03116 Cellulitis of left lower limb: Secondary | ICD-10-CM | POA: Diagnosis not present

## 2022-02-22 DIAGNOSIS — E559 Vitamin D deficiency, unspecified: Secondary | ICD-10-CM | POA: Diagnosis not present

## 2022-02-22 DIAGNOSIS — E785 Hyperlipidemia, unspecified: Secondary | ICD-10-CM | POA: Diagnosis not present

## 2022-02-22 DIAGNOSIS — R7303 Prediabetes: Secondary | ICD-10-CM | POA: Diagnosis not present

## 2022-02-22 DIAGNOSIS — M8580 Other specified disorders of bone density and structure, unspecified site: Secondary | ICD-10-CM | POA: Diagnosis not present

## 2022-02-22 DIAGNOSIS — I10 Essential (primary) hypertension: Secondary | ICD-10-CM | POA: Diagnosis not present

## 2022-02-22 DIAGNOSIS — Z0000 Encounter for general adult medical examination without abnormal findings: Secondary | ICD-10-CM | POA: Diagnosis not present

## 2022-03-01 ENCOUNTER — Other Ambulatory Visit: Payer: Self-pay | Admitting: Registered Nurse

## 2022-03-01 DIAGNOSIS — M069 Rheumatoid arthritis, unspecified: Secondary | ICD-10-CM | POA: Diagnosis not present

## 2022-03-01 DIAGNOSIS — E559 Vitamin D deficiency, unspecified: Secondary | ICD-10-CM | POA: Diagnosis not present

## 2022-03-01 DIAGNOSIS — Z01419 Encounter for gynecological examination (general) (routine) without abnormal findings: Secondary | ICD-10-CM | POA: Diagnosis not present

## 2022-03-01 DIAGNOSIS — E785 Hyperlipidemia, unspecified: Secondary | ICD-10-CM | POA: Diagnosis not present

## 2022-03-01 DIAGNOSIS — R7309 Other abnormal glucose: Secondary | ICD-10-CM | POA: Diagnosis not present

## 2022-03-01 DIAGNOSIS — N182 Chronic kidney disease, stage 2 (mild): Secondary | ICD-10-CM | POA: Diagnosis not present

## 2022-03-01 DIAGNOSIS — I70209 Unspecified atherosclerosis of native arteries of extremities, unspecified extremity: Secondary | ICD-10-CM | POA: Diagnosis not present

## 2022-03-01 DIAGNOSIS — M810 Age-related osteoporosis without current pathological fracture: Secondary | ICD-10-CM | POA: Diagnosis not present

## 2022-03-01 DIAGNOSIS — R0989 Other specified symptoms and signs involving the circulatory and respiratory systems: Secondary | ICD-10-CM | POA: Diagnosis not present

## 2022-03-01 DIAGNOSIS — J449 Chronic obstructive pulmonary disease, unspecified: Secondary | ICD-10-CM | POA: Diagnosis not present

## 2022-03-01 DIAGNOSIS — Z86010 Personal history of colonic polyps: Secondary | ICD-10-CM | POA: Diagnosis not present

## 2022-03-01 DIAGNOSIS — Z0000 Encounter for general adult medical examination without abnormal findings: Secondary | ICD-10-CM | POA: Diagnosis not present

## 2022-03-09 DIAGNOSIS — R0989 Other specified symptoms and signs involving the circulatory and respiratory systems: Secondary | ICD-10-CM | POA: Diagnosis not present

## 2022-03-09 DIAGNOSIS — I6523 Occlusion and stenosis of bilateral carotid arteries: Secondary | ICD-10-CM | POA: Diagnosis not present

## 2022-03-15 DIAGNOSIS — Z86010 Personal history of colonic polyps: Secondary | ICD-10-CM | POA: Diagnosis not present

## 2022-03-15 DIAGNOSIS — K439 Ventral hernia without obstruction or gangrene: Secondary | ICD-10-CM | POA: Diagnosis not present

## 2022-03-15 DIAGNOSIS — I739 Peripheral vascular disease, unspecified: Secondary | ICD-10-CM | POA: Diagnosis not present

## 2022-04-07 DIAGNOSIS — H6123 Impacted cerumen, bilateral: Secondary | ICD-10-CM | POA: Diagnosis not present

## 2022-04-17 NOTE — Progress Notes (Signed)
Fax received for medical clearance/medication hold for Dr. Cain.  Provider signed, form faxed back to sender, verified successful.  

## 2022-05-01 ENCOUNTER — Other Ambulatory Visit: Payer: Self-pay | Admitting: Vascular Surgery

## 2022-05-01 DIAGNOSIS — I739 Peripheral vascular disease, unspecified: Secondary | ICD-10-CM

## 2022-05-12 DIAGNOSIS — Z23 Encounter for immunization: Secondary | ICD-10-CM | POA: Diagnosis not present

## 2022-05-17 ENCOUNTER — Ambulatory Visit
Admission: RE | Admit: 2022-05-17 | Discharge: 2022-05-17 | Disposition: A | Discharge status: Home / Self Care | Payer: No Typology Code available for payment source | Source: Ambulatory Visit | Attending: Registered Nurse | Admitting: Registered Nurse

## 2022-05-17 DIAGNOSIS — I70209 Unspecified atherosclerosis of native arteries of extremities, unspecified extremity: Secondary | ICD-10-CM

## 2022-05-17 DIAGNOSIS — I700 Atherosclerosis of aorta: Secondary | ICD-10-CM | POA: Diagnosis not present

## 2022-05-24 DIAGNOSIS — J449 Chronic obstructive pulmonary disease, unspecified: Secondary | ICD-10-CM | POA: Diagnosis not present

## 2022-05-24 DIAGNOSIS — Z79899 Other long term (current) drug therapy: Secondary | ICD-10-CM | POA: Diagnosis not present

## 2022-05-24 DIAGNOSIS — E785 Hyperlipidemia, unspecified: Secondary | ICD-10-CM | POA: Diagnosis not present

## 2022-05-24 DIAGNOSIS — M059 Rheumatoid arthritis with rheumatoid factor, unspecified: Secondary | ICD-10-CM | POA: Diagnosis not present

## 2022-05-24 DIAGNOSIS — M810 Age-related osteoporosis without current pathological fracture: Secondary | ICD-10-CM | POA: Diagnosis not present

## 2022-05-30 ENCOUNTER — Other Ambulatory Visit: Payer: Self-pay | Admitting: Vascular Surgery

## 2022-05-30 DIAGNOSIS — I739 Peripheral vascular disease, unspecified: Secondary | ICD-10-CM

## 2022-08-06 DIAGNOSIS — J449 Chronic obstructive pulmonary disease, unspecified: Secondary | ICD-10-CM | POA: Diagnosis not present

## 2022-08-06 DIAGNOSIS — E785 Hyperlipidemia, unspecified: Secondary | ICD-10-CM | POA: Diagnosis not present

## 2022-08-06 DIAGNOSIS — M810 Age-related osteoporosis without current pathological fracture: Secondary | ICD-10-CM | POA: Diagnosis not present

## 2022-08-06 DIAGNOSIS — M059 Rheumatoid arthritis with rheumatoid factor, unspecified: Secondary | ICD-10-CM | POA: Diagnosis not present

## 2022-08-08 ENCOUNTER — Other Ambulatory Visit: Payer: Self-pay | Admitting: Vascular Surgery

## 2022-08-08 DIAGNOSIS — I739 Peripheral vascular disease, unspecified: Secondary | ICD-10-CM

## 2022-08-25 ENCOUNTER — Telehealth: Payer: Self-pay | Admitting: Pharmacist

## 2022-08-25 NOTE — Progress Notes (Signed)
[facility])                                            [place] Team    08/25/2022  Kylie Flowers 06-17-1946 [id number]  Placed telephonic outreach to [name]. HIPAA identifiers confirmed. Patient expressed interest in changing from 30 day supplies to 100 day supplies. Reviewed medications with patient, confirmed she would like clopidogrel, rosuvastatin, methotrexate, and prednisone changed to 100 day supplies.   Will coordinate new prescriptions be sent to [facility] per patient's request.   Loretha Brasil, PharmD [facility] Pharmacist Direct Dial: [phone]

## 2022-09-20 ENCOUNTER — Other Ambulatory Visit: Payer: Self-pay | Admitting: Vascular Surgery

## 2022-09-20 DIAGNOSIS — I739 Peripheral vascular disease, unspecified: Secondary | ICD-10-CM

## 2022-09-25 DIAGNOSIS — M059 Rheumatoid arthritis with rheumatoid factor, unspecified: Secondary | ICD-10-CM | POA: Diagnosis not present

## 2022-09-25 DIAGNOSIS — J449 Chronic obstructive pulmonary disease, unspecified: Secondary | ICD-10-CM | POA: Diagnosis not present

## 2022-09-25 DIAGNOSIS — M810 Age-related osteoporosis without current pathological fracture: Secondary | ICD-10-CM | POA: Diagnosis not present

## 2022-09-25 DIAGNOSIS — E785 Hyperlipidemia, unspecified: Secondary | ICD-10-CM | POA: Diagnosis not present

## 2022-09-25 DIAGNOSIS — Z79899 Other long term (current) drug therapy: Secondary | ICD-10-CM | POA: Diagnosis not present

## 2022-09-29 DIAGNOSIS — H6123 Impacted cerumen, bilateral: Secondary | ICD-10-CM | POA: Diagnosis not present

## 2022-11-16 ENCOUNTER — Other Ambulatory Visit: Payer: Self-pay

## 2022-11-16 ENCOUNTER — Other Ambulatory Visit: Payer: Self-pay | Admitting: Vascular Surgery

## 2022-11-16 DIAGNOSIS — I739 Peripheral vascular disease, unspecified: Secondary | ICD-10-CM

## 2022-11-16 MED ORDER — CLOPIDOGREL BISULFATE 75 MG PO TABS: 75.0000 mg | ORAL_TABLET | Freq: Every day | ORAL | 0 refills | Status: DC

## 2022-11-16 NOTE — Progress Notes (Signed)
Received refill requests for Plavix and Lipitor. After review of pt's chart, pt has not been seen since her aortogram on 12/2019. No post-op visit or staff msg. Called pt to schedule a f/u appt with [place], two identifiers used. Fortunately, pt has had no issues. Appts scheduled for [facility] & Dr. Randie Heinz. 30 day supply of Plavix sent to pharmacy, pt to renew at office visit. Confirmed understanding.

## 2022-11-20 ENCOUNTER — Other Ambulatory Visit: Payer: Self-pay | Admitting: Vascular Surgery

## 2022-11-20 DIAGNOSIS — I739 Peripheral vascular disease, unspecified: Secondary | ICD-10-CM

## 2022-12-13 ENCOUNTER — Ambulatory Visit: Payer: PPO | Admitting: Vascular Surgery

## 2022-12-13 ENCOUNTER — Ambulatory Visit (HOSPITAL_COMMUNITY)
Admission: RE | Admit: 2022-12-13 | Discharge: 2022-12-13 | Disposition: A | Discharge status: Home / Self Care | Payer: PPO | Source: Ambulatory Visit | Attending: Vascular Surgery | Admitting: Vascular Surgery

## 2022-12-13 ENCOUNTER — Ambulatory Visit (INDEPENDENT_AMBULATORY_CARE_PROVIDER_SITE_OTHER)
Admission: RE | Admit: 2022-12-13 | Discharge: 2022-12-13 | Disposition: A | Discharge status: Home / Self Care | Payer: PPO | Source: Ambulatory Visit | Attending: Vascular Surgery | Admitting: Vascular Surgery

## 2022-12-13 ENCOUNTER — Encounter: Payer: Self-pay | Admitting: Vascular Surgery

## 2022-12-13 VITALS — BP 147/62 | HR 58 | Temp 97.9°F | Resp 20 | Ht 62.0 in | Wt 140.8 lb

## 2022-12-13 DIAGNOSIS — I739 Peripheral vascular disease, unspecified: Secondary | ICD-10-CM | POA: Insufficient documentation

## 2022-12-13 LAB — VAS US ABI WITH/WO TBI
Left ABI: 0.96
Right ABI: 0.98

## 2022-12-13 NOTE — Progress Notes (Signed)
Patient ID: Kylie Flowers, female   DOB: [birth year], 77 y.o.   MRN: [id number]  Reason for Consult: Follow-up   Referred by Merri Brunette, MD  Subjective:     HPI:  Kylie Flowers is a 77 y.o. female history of right popliteal stent for right lower extremity ulceration and subsequent laser arthrectomy and drug-coated balloon angioplasty.  She has not had any symptoms of claudication or other wounds since our last evaluation now 3 years ago.  She is here today with right lower extremity duplex and ABIs.  She remains on Plavix and a statin.  Past Medical History:  Diagnosis Date   Anemia    in her teens   Arthritis    Rheumatoid Arthritis   Family history of adverse reaction to anesthesia    mom hallucinates after surgery   Heart murmur    has had it "for years"   Hypertension    Family History  Problem Relation Age of Onset   Hypertension Mother    Fibromyalgia Mother    Lung cancer Mother    Hypertension Father    Lung cancer Father    Past Surgical History:  Procedure Laterality Date   ABDOMINAL AORTOGRAM W/LOWER EXTREMITY Bilateral 04/21/2019   Procedure: ABDOMINAL AORTOGRAM W/LOWER EXTREMITY;  Surgeon: Maeola Harman, MD;  Location: [facility] INVASIVE CV LAB;  Service: Cardiovascular;  Laterality: Bilateral;   ABDOMINAL AORTOGRAM W/LOWER EXTREMITY N/A 12/08/2019   Procedure: ABDOMINAL AORTOGRAM W/ Right LOWER EXTREMITY;  Surgeon: Maeola Harman, MD;  Location: [facility] INVASIVE CV LAB;  Service: Cardiovascular;  Laterality: N/A;   BUNIONECTOMY     COLONOSCOPY     DILATION AND CURETTAGE, DIAGNOSTIC / THERAPEUTIC     EYE SURGERY Bilateral    cataract surgery with lens implant   INGUINAL HERNIA REPAIR Left 07/21/2016   Procedure: LAPAROSCOPIC INDIRECT INGUINAL HERNIA;  Surgeon: Berna Bue, MD;  Location: [facility] OR;  Service: General;  Laterality: Left;   INSERTION OF MESH N/A 07/21/2016   Procedure: INSERTION OF MESH;  Surgeon: Berna Bue, MD;   Location: [facility] OR;  Service: General;  Laterality: N/A;   PERIPHERAL VASCULAR ATHERECTOMY Right 12/08/2019   Procedure: PERIPHERAL VASCULAR ATHERECTOMY;  Surgeon: Maeola Harman, MD;  Location: [facility] INVASIVE CV LAB;  Service: Cardiovascular;  Laterality: Right;  popliteal   PERIPHERAL VASCULAR BALLOON ANGIOPLASTY Right 12/08/2019   Procedure: PERIPHERAL VASCULAR BALLOON ANGIOPLASTY;  Surgeon: Maeola Harman, MD;  Location: [facility] INVASIVE CV LAB;  Service: Cardiovascular;  Laterality: Right;  popliteal   PERIPHERAL VASCULAR INTERVENTION Right 04/21/2019   Procedure: PERIPHERAL VASCULAR INTERVENTION;  Surgeon: Maeola Harman, MD;  Location: [facility] INVASIVE CV LAB;  Service: Cardiovascular;  Laterality: Right;  popliteal stent   TUBAL LIGATION      Short Social History:  Social History   Tobacco Use   Smoking status: Former   Smokeless tobacco: Never   Tobacco comments:    maybe smokes 5 cigarettes a day  Substance Use Topics   Alcohol use: Yes    Comment: occasional    Allergies  Allergen Reactions   Penicillins Hives    Did it involve swelling of the face/tongue/throat, SOB, or low BP? Unknown Did it involve sudden or severe rash/hives, skin peeling, or any reaction on the inside of your mouth or nose? Unknown Did you need to seek medical attention at a hospital or doctor's office? No When did it last happen?      childhood allergy If all above  answers are "NO", may proceed with cephalosporin use.     Current Outpatient Medications  Medication Sig Dispense Refill   Cholecalciferol (VITAMIN D-3) 125 MCG (5000 UT) TABS Take 5,000 Units by mouth at bedtime.     clopidogrel (PLAVIX) 75 MG tablet TAKE 1 TABLET BY MOUTH EVERY DAY 30 tablet 0   COVID-19 mRNA Vac-TriS, Pfizer, SUSP injection Inject into the muscle. 0.3 mL 0   folic acid (FOLVITE) 1 MG tablet Take 1 mg by mouth at bedtime.      methotrexate (RHEUMATREX) 2.5 MG tablet Take 10 mg by mouth every Tuesday.  Caution:Chemotherapy. Protect from light At bedtime     predniSONE (DELTASONE) 1 MG tablet Take 3 mg by mouth at bedtime.   0   rosuvastatin (CRESTOR) 10 MG tablet TAKE 1 TABLET BY MOUTH AT BEDTIME 30 tablet 4   No current facility-administered medications for this visit.    Review of Systems  Constitutional:  Constitutional negative. HENT: HENT negative.  Eyes: Eyes negative.  Respiratory: Respiratory negative.  Cardiovascular: Cardiovascular negative.  GI: Gastrointestinal negative.  Musculoskeletal: Musculoskeletal negative.  Skin: Skin negative.  Neurological: Neurological negative. Hematologic: Hematologic/lymphatic negative.  Psychiatric: Psychiatric negative.        Objective:  Objective   Vitals:   12/13/22 1534  BP: (!) 147/62  Pulse: (!) 58  Resp: 20  Temp: 97.9 F (36.6 C)  SpO2: 98%  Weight: 140 lb 12.8 oz (63.9 kg)  Height: 5\' 2"  (1.575 m)   Body mass index is 25.75 kg/m.  Physical Exam HENT:     Head: Normocephalic.     Nose: Nose normal.  Eyes:     Pupils: Pupils are equal, round, and reactive to light.  Cardiovascular:     Pulses: Normal pulses.  Pulmonary:     Effort: Pulmonary effort is normal.  Abdominal:     General: Abdomen is flat.  Musculoskeletal:        General: Normal range of motion.     Right lower leg: No edema.     Left lower leg: No edema.  Skin:    General: Skin is warm.     Capillary Refill: Capillary refill takes less than 2 seconds.  Neurological:     General: No focal deficit present.     Mental Status: She is alert.  Psychiatric:        Mood and Affect: Mood normal.        Thought Content: Thought content normal.        Judgment: Judgment normal.     Data: ABI Findings:  +---------+------------------+-----+---------+--------+  Right   Rt Pressure (mmHg)IndexWaveform Comment   +---------+------------------+-----+---------+--------+  Brachial 136                                        +---------+------------------+-----+---------+--------+  PTA     128               0.91 triphasic          +---------+------------------+-----+---------+--------+  DP      137               0.98 triphasic          +---------+------------------+-----+---------+--------+  Great Toe145               1.04 Normal             +---------+------------------+-----+---------+--------+   +---------+------------------+-----+---------+-------+  Left    Lt Pressure (mmHg)IndexWaveform Comment  +---------+------------------+-----+---------+-------+  Brachial 140                                      +---------+------------------+-----+---------+-------+  PTA     133               0.95 triphasic         +---------+------------------+-----+---------+-------+  DP      134               0.96 triphasic         +---------+------------------+-----+---------+-------+  Great Toe69                0.49 Abnormal          +---------+------------------+-----+---------+-------+   +-------+-----------+-----------+------------+------------+  ABI/TBIToday's ABIToday's TBIPrevious ABIPrevious TBI  +-------+-----------+-----------+------------+------------+  Right 0.98       1.04       0.54        0             +-------+-----------+-----------+------------+------------+  Left  0.96       0.49       0.98        0.74          +-------+-----------+-----------+------------+------------+   RIGHT      PSV cm/sRatioStenosisWaveform Comments  +-----------+--------+-----+--------+---------+--------+  CFA Prox   151                  biphasic           +-----------+--------+-----+--------+---------+--------+  DFA       133                  biphasic           +-----------+--------+-----+--------+---------+--------+  SFA Prox   145                  biphasic           +-----------+--------+-----+--------+---------+--------+  SFA Mid     111                  biphasic           +-----------+--------+-----+--------+---------+--------+  SFA Distal 140                  biphasic           +-----------+--------+-----+--------+---------+--------+  TP Trunk   112                  biphasic           +-----------+--------+-----+--------+---------+--------+  ATA Distal 68                   biphasic           +-----------+--------+-----+--------+---------+--------+  PTA Distal 53                   triphasic          +-----------+--------+-----+--------+---------+--------+  PERO Distal53                   triphasic          +-----------+--------+-----+--------+---------+--------+       Right Stent(s):  +---------------+--------+--------+--------+--------+  politeal      PSV cm/sStenosisWaveformComments  +---------------+--------+--------+--------+--------+  Prox to Stent  115                               +---------------+--------+--------+--------+--------+  Proximal Stent 165                               +---------------+--------+--------+--------+--------+  Mid Stent      125                               +---------------+--------+--------+--------+--------+  Distal Stent   130                               +---------------+--------+--------+--------+--------+  Distal to Stent119                               +---------------+--------+--------+--------+--------+                Summary:  Right: Patent stent with no evidence of stenosis in the popliteal artery  artery.           Assessment/Plan:     77 year old female with widely patent stent status post popliteal stenting with reintervention for in-stent restenosis.  All her wounds have healed and she remains asymptomatic.  Plan will be to follow-up in 1 year with repeat studies.  She will remain on Plavix and statin.     Maeola Harman MD [facility]

## 2022-12-25 ENCOUNTER — Other Ambulatory Visit: Payer: Self-pay

## 2022-12-25 DIAGNOSIS — I739 Peripheral vascular disease, unspecified: Secondary | ICD-10-CM

## 2022-12-27 ENCOUNTER — Other Ambulatory Visit: Payer: Self-pay | Admitting: Vascular Surgery

## 2022-12-27 DIAGNOSIS — I739 Peripheral vascular disease, unspecified: Secondary | ICD-10-CM

## 2023-01-19 DIAGNOSIS — E785 Hyperlipidemia, unspecified: Secondary | ICD-10-CM | POA: Diagnosis not present

## 2023-01-19 DIAGNOSIS — M810 Age-related osteoporosis without current pathological fracture: Secondary | ICD-10-CM | POA: Diagnosis not present

## 2023-01-19 DIAGNOSIS — M8580 Other specified disorders of bone density and structure, unspecified site: Secondary | ICD-10-CM | POA: Diagnosis not present

## 2023-01-19 DIAGNOSIS — Z79899 Other long term (current) drug therapy: Secondary | ICD-10-CM | POA: Diagnosis not present

## 2023-01-19 DIAGNOSIS — J449 Chronic obstructive pulmonary disease, unspecified: Secondary | ICD-10-CM | POA: Diagnosis not present

## 2023-01-19 DIAGNOSIS — M059 Rheumatoid arthritis with rheumatoid factor, unspecified: Secondary | ICD-10-CM | POA: Diagnosis not present

## 2023-03-14 DIAGNOSIS — R5383 Other fatigue: Secondary | ICD-10-CM | POA: Diagnosis not present

## 2023-03-14 DIAGNOSIS — E785 Hyperlipidemia, unspecified: Secondary | ICD-10-CM | POA: Diagnosis not present

## 2023-03-14 DIAGNOSIS — R7309 Other abnormal glucose: Secondary | ICD-10-CM | POA: Diagnosis not present

## 2023-03-14 DIAGNOSIS — I10 Essential (primary) hypertension: Secondary | ICD-10-CM | POA: Diagnosis not present

## 2023-03-14 DIAGNOSIS — E559 Vitamin D deficiency, unspecified: Secondary | ICD-10-CM | POA: Diagnosis not present

## 2023-03-21 DIAGNOSIS — E785 Hyperlipidemia, unspecified: Secondary | ICD-10-CM | POA: Diagnosis not present

## 2023-03-21 DIAGNOSIS — I700 Atherosclerosis of aorta: Secondary | ICD-10-CM | POA: Diagnosis not present

## 2023-03-21 DIAGNOSIS — Z0000 Encounter for general adult medical examination without abnormal findings: Secondary | ICD-10-CM | POA: Diagnosis not present

## 2023-03-21 DIAGNOSIS — R7309 Other abnormal glucose: Secondary | ICD-10-CM | POA: Diagnosis not present

## 2023-03-21 DIAGNOSIS — E559 Vitamin D deficiency, unspecified: Secondary | ICD-10-CM | POA: Diagnosis not present

## 2023-03-21 DIAGNOSIS — E875 Hyperkalemia: Secondary | ICD-10-CM | POA: Diagnosis not present

## 2023-03-21 DIAGNOSIS — M8580 Other specified disorders of bone density and structure, unspecified site: Secondary | ICD-10-CM | POA: Diagnosis not present

## 2023-03-21 DIAGNOSIS — I70209 Unspecified atherosclerosis of native arteries of extremities, unspecified extremity: Secondary | ICD-10-CM | POA: Diagnosis not present

## 2023-04-04 DIAGNOSIS — H6123 Impacted cerumen, bilateral: Secondary | ICD-10-CM | POA: Diagnosis not present

## 2023-05-18 DIAGNOSIS — Z23 Encounter for immunization: Secondary | ICD-10-CM | POA: Diagnosis not present

## 2023-07-25 DIAGNOSIS — M21621 Bunionette of right foot: Secondary | ICD-10-CM | POA: Diagnosis not present

## 2023-09-06 ENCOUNTER — Other Ambulatory Visit: Payer: Self-pay | Admitting: Vascular Surgery

## 2023-09-06 DIAGNOSIS — I739 Peripheral vascular disease, unspecified: Secondary | ICD-10-CM

## 2023-09-11 DIAGNOSIS — M21621 Bunionette of right foot: Secondary | ICD-10-CM | POA: Diagnosis not present

## 2023-09-17 NOTE — Progress Notes (Signed)
 Fax received from [facility] on 09/07/23 for medical clearance/medication hold for excision of right foot tailor's bunion, possible MT osteotomy to be signed by B. Vikki Graves, MD.  Provider signed on 09/12/23, scanned into pt's chart, media routed via MyChart to sender on 09/17/23.

## 2023-10-04 ENCOUNTER — Telehealth (INDEPENDENT_AMBULATORY_CARE_PROVIDER_SITE_OTHER): Payer: Self-pay | Admitting: Otolaryngology

## 2023-10-04 NOTE — Telephone Encounter (Signed)
 Left vm to confirm appt and address for 10/05/2023.

## 2023-10-05 ENCOUNTER — Encounter (INDEPENDENT_AMBULATORY_CARE_PROVIDER_SITE_OTHER): Payer: Self-pay

## 2023-10-05 ENCOUNTER — Ambulatory Visit (INDEPENDENT_AMBULATORY_CARE_PROVIDER_SITE_OTHER): Payer: PPO | Admitting: Otolaryngology

## 2023-10-05 VITALS — HR 80 | Ht 62.0 in | Wt 130.0 lb

## 2023-10-05 DIAGNOSIS — H6123 Impacted cerumen, bilateral: Secondary | ICD-10-CM | POA: Diagnosis not present

## 2023-10-06 DIAGNOSIS — H6123 Impacted cerumen, bilateral: Secondary | ICD-10-CM | POA: Insufficient documentation

## 2023-10-06 NOTE — Progress Notes (Signed)
 Patient ID: Kylie Flowers, female   DOB: 01-Mar-1946, 78 y.o.   MRN: [id number]  Follow-up: Recurrent cerumen impaction  Procedure: Bilateral cerumen disimpaction.   Indication: Cerumen impaction, resulting in ear discomfort and conductive hearing loss.   Description: The patient is placed supine on the operating table. Under the operating microscope, the right ear canal is examined and is noted to be impacted with cerumen. The cerumen is carefully removed with a combination of suction catheters, cerumen curette, and alligator forceps. After the cerumen removal, the ear canal and tympanic membrane are noted to be normal. No middle ear effusion is noted. The same procedure is then repeated on the left side without exception. The patient tolerated the procedure well.  Follow-up care:  The patient is instructed not to use Q-tips to clean the ear canals. The patient will follow up in 6 months.

## 2023-10-16 DIAGNOSIS — E785 Hyperlipidemia, unspecified: Secondary | ICD-10-CM | POA: Diagnosis not present

## 2023-10-23 DIAGNOSIS — I2510 Atherosclerotic heart disease of native coronary artery without angina pectoris: Secondary | ICD-10-CM | POA: Diagnosis not present

## 2023-10-23 DIAGNOSIS — I700 Atherosclerosis of aorta: Secondary | ICD-10-CM | POA: Diagnosis not present

## 2023-10-23 DIAGNOSIS — M810 Age-related osteoporosis without current pathological fracture: Secondary | ICD-10-CM | POA: Diagnosis not present

## 2023-10-23 DIAGNOSIS — E875 Hyperkalemia: Secondary | ICD-10-CM | POA: Diagnosis not present

## 2023-10-23 DIAGNOSIS — M059 Rheumatoid arthritis with rheumatoid factor, unspecified: Secondary | ICD-10-CM | POA: Diagnosis not present

## 2023-10-23 DIAGNOSIS — I10 Essential (primary) hypertension: Secondary | ICD-10-CM | POA: Diagnosis not present

## 2023-10-23 DIAGNOSIS — E785 Hyperlipidemia, unspecified: Secondary | ICD-10-CM | POA: Diagnosis not present

## 2023-10-23 DIAGNOSIS — E559 Vitamin D deficiency, unspecified: Secondary | ICD-10-CM | POA: Diagnosis not present

## 2023-12-28 DIAGNOSIS — M1991 Primary osteoarthritis, unspecified site: Secondary | ICD-10-CM | POA: Diagnosis not present

## 2023-12-28 DIAGNOSIS — M7989 Other specified soft tissue disorders: Secondary | ICD-10-CM | POA: Diagnosis not present

## 2023-12-28 DIAGNOSIS — Z6823 Body mass index (BMI) 23.0-23.9, adult: Secondary | ICD-10-CM | POA: Diagnosis not present

## 2023-12-28 DIAGNOSIS — M059 Rheumatoid arthritis with rheumatoid factor, unspecified: Secondary | ICD-10-CM | POA: Diagnosis not present

## 2023-12-28 DIAGNOSIS — Z79899 Other long term (current) drug therapy: Secondary | ICD-10-CM | POA: Diagnosis not present

## 2024-01-14 ENCOUNTER — Other Ambulatory Visit: Payer: Self-pay

## 2024-01-14 DIAGNOSIS — I739 Peripheral vascular disease, unspecified: Secondary | ICD-10-CM

## 2024-01-24 DIAGNOSIS — M059 Rheumatoid arthritis with rheumatoid factor, unspecified: Secondary | ICD-10-CM | POA: Diagnosis not present

## 2024-01-30 ENCOUNTER — Ambulatory Visit: Attending: Vascular Surgery | Admitting: Physician Assistant

## 2024-01-30 ENCOUNTER — Ambulatory Visit (HOSPITAL_COMMUNITY)
Admission: RE | Admit: 2024-01-30 | Discharge: 2024-01-30 | Discharge status: Home / Self Care | Source: Ambulatory Visit | Attending: Vascular Surgery

## 2024-01-30 ENCOUNTER — Ambulatory Visit (HOSPITAL_COMMUNITY)
Admission: RE | Admit: 2024-01-30 | Discharge: 2024-01-30 | Disposition: A | Discharge status: Home / Self Care | Source: Ambulatory Visit | Attending: Vascular Surgery | Admitting: Vascular Surgery

## 2024-01-30 VITALS — BP 146/80 | HR 64 | Temp 98.0°F | Ht 62.0 in | Wt 131.7 lb

## 2024-01-30 DIAGNOSIS — I739 Peripheral vascular disease, unspecified: Secondary | ICD-10-CM

## 2024-01-30 DIAGNOSIS — I70201 Unspecified atherosclerosis of native arteries of extremities, right leg: Secondary | ICD-10-CM | POA: Diagnosis not present

## 2024-01-30 LAB — VAS US ABI WITH/WO TBI
Left ABI: 1.03
Right ABI: 0.97

## 2024-01-30 NOTE — Progress Notes (Signed)
 Office Note     CC:  follow up Requesting Provider:  Clarice Nottingham, MD  HPI: Kylie Flowers is a 78 y.o. (04-Aug-1946) female who presents for surveillance follow up of PAD. She has history of right popliteal stent for right lower extremity ulceration in September of 2020 and subsequent laser arthrectomy and drug-coated balloon angioplasty for recurrent stenosis in May of 2021 by Dr. Sheree. She has been without any claudication, rest pain or tissue loss since her interventions. Today she she says overall she is doing well. She has no concerns today. She says she stays fairly active. She has a 72 year old dog that she and her husband enjoy taking for walks often. She is medically managed on Plavix  and Statin.   Past Medical History:  Diagnosis Date   Anemia    in her teens   Arthritis    Rheumatoid Arthritis   Family history of adverse reaction to anesthesia    mom hallucinates after surgery   Heart murmur    has had it for years   Hypertension     Past Surgical History:  Procedure Laterality Date   ABDOMINAL AORTOGRAM W/LOWER EXTREMITY Bilateral 04/21/2019   Procedure: ABDOMINAL AORTOGRAM W/LOWER EXTREMITY;  Surgeon: Sheree Penne Bruckner, MD;  Location: [facility] INVASIVE CV LAB;  Service: Cardiovascular;  Laterality: Bilateral;   ABDOMINAL AORTOGRAM W/LOWER EXTREMITY N/A 12/08/2019   Procedure: ABDOMINAL AORTOGRAM W/ Right LOWER EXTREMITY;  Surgeon: Sheree Penne Bruckner, MD;  Location: [facility] INVASIVE CV LAB;  Service: Cardiovascular;  Laterality: N/A;   BUNIONECTOMY     COLONOSCOPY     DILATION AND CURETTAGE, DIAGNOSTIC / THERAPEUTIC     EYE SURGERY Bilateral    cataract surgery with lens implant   INGUINAL HERNIA REPAIR Left 07/21/2016   Procedure: LAPAROSCOPIC INDIRECT INGUINAL HERNIA;  Surgeon: Mitzie DELENA Freund, MD;  Location: [facility] OR;  Service: General;  Laterality: Left;   INSERTION OF MESH N/A 07/21/2016   Procedure: INSERTION OF MESH;  Surgeon: Mitzie DELENA Freund, MD;   Location: [facility] OR;  Service: General;  Laterality: N/A;   PERIPHERAL VASCULAR ATHERECTOMY Right 12/08/2019   Procedure: PERIPHERAL VASCULAR ATHERECTOMY;  Surgeon: Sheree Penne Bruckner, MD;  Location: [facility] INVASIVE CV LAB;  Service: Cardiovascular;  Laterality: Right;  popliteal   PERIPHERAL VASCULAR BALLOON ANGIOPLASTY Right 12/08/2019   Procedure: PERIPHERAL VASCULAR BALLOON ANGIOPLASTY;  Surgeon: Sheree Penne Bruckner, MD;  Location: [facility] INVASIVE CV LAB;  Service: Cardiovascular;  Laterality: Right;  popliteal   PERIPHERAL VASCULAR INTERVENTION Right 04/21/2019   Procedure: PERIPHERAL VASCULAR INTERVENTION;  Surgeon: Sheree Penne Bruckner, MD;  Location: [facility] INVASIVE CV LAB;  Service: Cardiovascular;  Laterality: Right;  popliteal stent   TUBAL LIGATION      Social History   Socioeconomic History   Marital status: Married    Spouse name: Not on file   Number of children: Not on file   Years of education: Not on file   Highest education level: Not on file  Occupational History   Not on file  Tobacco Use   Smoking status: Former   Smokeless tobacco: Never  Vaping Use   Vaping status: Never Used  Substance and Sexual Activity   Alcohol use: Yes    Comment: occasional   Drug use: No   Sexual activity: Not on file  Other Topics Concern   Not on file  Social History Narrative   Not on file   Social Drivers of Health   Financial Resource Strain: Not on file  Food Insecurity: Not on file  Transportation Needs: Not on file  Physical Activity: Not on file  Stress: Not on file  Social Connections: Not on file  Intimate Partner Violence: Not on file    Family History  Problem Relation Age of Onset   Hypertension Mother    Fibromyalgia Mother    Lung cancer Mother    Hypertension Father    Lung cancer Father     Current Outpatient Medications  Medication Sig Dispense Refill   Cholecalciferol (VITAMIN D-3) 125 MCG (5000 UT) TABS Take 5,000 Units by mouth at bedtime.      clopidogrel  (PLAVIX ) 75 MG tablet TAKE 1 TABLET BY MOUTH EVERY DAY 30 tablet 11   COVID-19 mRNA Vac-TriS, Pfizer, SUSP injection Inject into the muscle. 0.3 mL 0   folic acid (FOLVITE) 1 MG tablet Take 1 mg by mouth at bedtime.      methotrexate (RHEUMATREX) 2.5 MG tablet Take 10 mg by mouth every Tuesday. Caution:Chemotherapy. Protect from light At bedtime     predniSONE (DELTASONE) 1 MG tablet Take 3 mg by mouth at bedtime.   0   rosuvastatin  (CRESTOR ) 10 MG tablet TAKE 1 TABLET BY MOUTH AT BEDTIME 30 tablet 4   No current facility-administered medications for this visit.    Allergies  Allergen Reactions   Penicillins Hives    Did it involve swelling of the face/tongue/throat, SOB, or low BP? Unknown Did it involve sudden or severe rash/hives, skin peeling, or any reaction on the inside of your mouth or nose? Unknown Did you need to seek medical attention at a hospital or doctor's office? No When did it last happen?      childhood allergy If all above answers are "NO", may proceed with cephalosporin use.      REVIEW OF SYSTEMS:  [X]  denotes positive finding, [ ]  denotes negative finding Cardiac  Comments:  Chest pain or chest pressure:    Shortness of breath upon exertion:    Short of breath when lying flat:    Irregular heart rhythm:        Vascular    Pain in calf, thigh, or hip brought on by ambulation:    Pain in feet at night that wakes you up from your sleep:     Blood clot in your veins:    Leg swelling:         Pulmonary    Oxygen at home:    Productive cough:     Wheezing:         Neurologic    Sudden weakness in arms or legs:     Sudden numbness in arms or legs:     Sudden onset of difficulty speaking or slurred speech:    Temporary loss of vision in one eye:     Problems with dizziness:         Gastrointestinal    Blood in stool:     Vomited blood:         Genitourinary    Burning when urinating:     Blood in urine:        Psychiatric    Major  depression:         Hematologic    Bleeding problems:    Problems with blood clotting too easily:        Skin    Rashes or ulcers:        Constitutional    Fever or chills:      PHYSICAL EXAMINATION:  Vitals:   01/30/24 1317  BP: (!) 146/80  Pulse: 64  Temp: 98 F (36.7 C)  TempSrc: Temporal  SpO2: 98%  Weight: 131 lb 11.2 oz (59.7 kg)  Height: 5' 2 (1.575 m)    General:  WDWN in NAD; vital signs documented above Gait: Normal HENT: WNL, normocephalic Pulmonary: normal non-labored breathing Cardiac: regular HR Abdomen: soft Vascular Exam/Pulses: 2+ femoral, 2+ popliteal, 2+ DP pulses bilaterally. Feet warm and well perfused Extremities: without ischemic changes, without Gangrene , without cellulitis; without open wounds;  Musculoskeletal: no muscle wasting or atrophy  Neurologic: A&O X 3 Psychiatric:  The pt has Normal affect.   Non-Invasive Vascular Imaging:   +-------+-----------+-----------+------------+------------+  ABI/TBIToday's ABIToday's TBIPrevious ABIPrevious TBI  +-------+-----------+-----------+------------+------------+  Right 0.97       0.61       0.98        1.04          +-------+-----------+-----------+------------+------------+  Left  1.03       0.57       0.96        0.49          +-------+-----------+-----------+------------+------------+   VAS US  Lower extremity arterial duplex: Summary:  Right: Patent stent with no evidence of stenosis in the popliteal artery artery.    ASSESSMENT/PLAN:: 78 y.o. female here for surveillance follow up of PAD. She has history of right popliteal stent for right lower extremity ulceration in September of 2020 and subsequent laser arthrectomy and drug-coated balloon angioplasty for recurrent stenosis in May of 2021 by Dr. Sheree. She is without claudication, rest pain or tissue loss. - Duplex shows patent right popliteal artery stent - ABIs are essentially unchanged from her prior study -  Encourage exercise regimen - continue Statin and Plavix  - She will follow up in 1 year with repeat RLE arterial duplex and ABI   Teretha Damme, PA-C [facility] [phone]  Clinic MD:   Sheree

## 2024-03-17 DIAGNOSIS — E875 Hyperkalemia: Secondary | ICD-10-CM | POA: Diagnosis not present

## 2024-03-17 DIAGNOSIS — N182 Chronic kidney disease, stage 2 (mild): Secondary | ICD-10-CM | POA: Diagnosis not present

## 2024-03-24 DIAGNOSIS — I2510 Atherosclerotic heart disease of native coronary artery without angina pectoris: Secondary | ICD-10-CM | POA: Diagnosis not present

## 2024-03-24 DIAGNOSIS — I700 Atherosclerosis of aorta: Secondary | ICD-10-CM | POA: Diagnosis not present

## 2024-03-24 DIAGNOSIS — E785 Hyperlipidemia, unspecified: Secondary | ICD-10-CM | POA: Diagnosis not present

## 2024-03-24 DIAGNOSIS — E559 Vitamin D deficiency, unspecified: Secondary | ICD-10-CM | POA: Diagnosis not present

## 2024-03-24 DIAGNOSIS — Z0000 Encounter for general adult medical examination without abnormal findings: Secondary | ICD-10-CM | POA: Diagnosis not present

## 2024-03-24 DIAGNOSIS — M810 Age-related osteoporosis without current pathological fracture: Secondary | ICD-10-CM | POA: Diagnosis not present

## 2024-03-24 DIAGNOSIS — R7309 Other abnormal glucose: Secondary | ICD-10-CM | POA: Diagnosis not present

## 2024-03-28 DIAGNOSIS — M059 Rheumatoid arthritis with rheumatoid factor, unspecified: Secondary | ICD-10-CM | POA: Diagnosis not present

## 2024-03-28 DIAGNOSIS — R5383 Other fatigue: Secondary | ICD-10-CM | POA: Diagnosis not present

## 2024-03-28 DIAGNOSIS — E559 Vitamin D deficiency, unspecified: Secondary | ICD-10-CM | POA: Diagnosis not present

## 2024-03-28 DIAGNOSIS — Z79899 Other long term (current) drug therapy: Secondary | ICD-10-CM | POA: Diagnosis not present

## 2024-03-28 DIAGNOSIS — M1991 Primary osteoarthritis, unspecified site: Secondary | ICD-10-CM | POA: Diagnosis not present

## 2024-03-28 DIAGNOSIS — Z6824 Body mass index (BMI) 24.0-24.9, adult: Secondary | ICD-10-CM | POA: Diagnosis not present

## 2024-03-28 DIAGNOSIS — Z0000 Encounter for general adult medical examination without abnormal findings: Secondary | ICD-10-CM | POA: Diagnosis not present

## 2024-03-28 DIAGNOSIS — R7309 Other abnormal glucose: Secondary | ICD-10-CM | POA: Diagnosis not present

## 2024-04-04 ENCOUNTER — Ambulatory Visit (INDEPENDENT_AMBULATORY_CARE_PROVIDER_SITE_OTHER): Payer: PPO | Admitting: Otolaryngology

## 2024-04-17 ENCOUNTER — Encounter (INDEPENDENT_AMBULATORY_CARE_PROVIDER_SITE_OTHER): Payer: Self-pay | Admitting: Otolaryngology

## 2024-04-17 ENCOUNTER — Ambulatory Visit (INDEPENDENT_AMBULATORY_CARE_PROVIDER_SITE_OTHER): Admitting: Otolaryngology

## 2024-04-17 VITALS — BP 124/53 | HR 66

## 2024-04-17 DIAGNOSIS — H6123 Impacted cerumen, bilateral: Secondary | ICD-10-CM

## 2024-04-17 NOTE — Progress Notes (Signed)
 Patient ID: FELICE HOPE, female   DOB: 1946-07-22, 78 y.o.   MRN: [id number]  Follow-up: Recurrent cerumen impaction  Procedure: Bilateral cerumen disimpaction.   Indication: Cerumen impaction, resulting in ear discomfort and conductive hearing loss.   Description: The patient is placed supine on the operating table. Under the operating microscope, the right ear canal is examined and is noted to be impacted with cerumen. The cerumen is carefully removed with a combination of suction catheters, cerumen curette, and alligator forceps. After the cerumen removal, the ear canal and tympanic membrane are noted to be normal. No middle ear effusion is noted. The same procedure is then repeated on the left side without exception. The patient tolerated the procedure well.  Follow-up care:  The patient will follow up in 6 months.

## 2024-04-25 DIAGNOSIS — M059 Rheumatoid arthritis with rheumatoid factor, unspecified: Secondary | ICD-10-CM | POA: Diagnosis not present

## 2024-05-16 DIAGNOSIS — Z23 Encounter for immunization: Secondary | ICD-10-CM | POA: Diagnosis not present

## 2024-05-23 DIAGNOSIS — M1991 Primary osteoarthritis, unspecified site: Secondary | ICD-10-CM | POA: Diagnosis not present

## 2024-05-23 DIAGNOSIS — M059 Rheumatoid arthritis with rheumatoid factor, unspecified: Secondary | ICD-10-CM | POA: Diagnosis not present

## 2024-05-23 DIAGNOSIS — Z79899 Other long term (current) drug therapy: Secondary | ICD-10-CM | POA: Diagnosis not present

## 2024-05-23 DIAGNOSIS — Z6823 Body mass index (BMI) 23.0-23.9, adult: Secondary | ICD-10-CM | POA: Diagnosis not present

## 2024-10-01 ENCOUNTER — Ambulatory Visit (INDEPENDENT_AMBULATORY_CARE_PROVIDER_SITE_OTHER): Admitting: Otolaryngology
